# Patient Record
Sex: Female | Born: 1980 | Race: White | Hispanic: No | Smoking: Former smoker
Health system: Southern US, Community
[De-identification: ages and names within clinical notes are randomized; demographics above are authoritative.]

## PROBLEM LIST (undated history)

## (undated) ENCOUNTER — Ambulatory Visit: Admission: EM | Payer: Self-pay | Source: Home / Self Care

## (undated) DIAGNOSIS — I7300 Raynaud's syndrome without gangrene: Secondary | ICD-10-CM

## (undated) DIAGNOSIS — M797 Fibromyalgia: Secondary | ICD-10-CM

## (undated) DIAGNOSIS — J189 Pneumonia, unspecified organism: Secondary | ICD-10-CM

## (undated) HISTORY — PX: SHOULDER SURGERY: SHX246

---

## 2012-04-07 ENCOUNTER — Encounter (HOSPITAL_BASED_OUTPATIENT_CLINIC_OR_DEPARTMENT_OTHER): Payer: Self-pay | Admitting: Family Medicine

## 2012-04-07 ENCOUNTER — Emergency Department (HOSPITAL_BASED_OUTPATIENT_CLINIC_OR_DEPARTMENT_OTHER)
Admission: EM | Admit: 2012-04-07 | Discharge: 2012-04-07 | Disposition: A | Discharge status: Home / Self Care | Payer: Self-pay | Attending: Emergency Medicine | Admitting: Emergency Medicine

## 2012-04-07 DIAGNOSIS — R509 Fever, unspecified: Secondary | ICD-10-CM | POA: Insufficient documentation

## 2012-04-07 DIAGNOSIS — IMO0001 Reserved for inherently not codable concepts without codable children: Secondary | ICD-10-CM | POA: Insufficient documentation

## 2012-04-07 DIAGNOSIS — R21 Rash and other nonspecific skin eruption: Secondary | ICD-10-CM | POA: Insufficient documentation

## 2012-04-07 DIAGNOSIS — R51 Headache: Secondary | ICD-10-CM | POA: Insufficient documentation

## 2012-04-07 DIAGNOSIS — F17200 Nicotine dependence, unspecified, uncomplicated: Secondary | ICD-10-CM | POA: Insufficient documentation

## 2012-04-07 DIAGNOSIS — M2550 Pain in unspecified joint: Secondary | ICD-10-CM | POA: Insufficient documentation

## 2012-04-07 HISTORY — DX: Fibromyalgia: M79.7

## 2012-04-07 LAB — COMPREHENSIVE METABOLIC PANEL
ALT: 14 U/L (ref 0–35)
AST: 15 U/L (ref 0–37)
Alkaline Phosphatase: 55 U/L (ref 39–117)
CO2: 25 mEq/L (ref 19–32)
Calcium: 8.7 mg/dL (ref 8.4–10.5)
Potassium: 3.9 mEq/L (ref 3.5–5.1)
Sodium: 140 mEq/L (ref 135–145)
Total Protein: 5.8 g/dL — ABNORMAL LOW (ref 6.0–8.3)

## 2012-04-07 LAB — CBC
MCV: 99.2 fL (ref 78.0–100.0)
Platelets: 171 10*3/uL (ref 150–400)
RDW: 12.9 % (ref 11.5–15.5)
WBC: 5.5 10*3/uL (ref 4.0–10.5)

## 2012-04-07 LAB — DIFFERENTIAL
Basophils Absolute: 0 10*3/uL (ref 0.0–0.1)
Eosinophils Absolute: 0.1 10*3/uL (ref 0.0–0.7)
Eosinophils Relative: 2 % (ref 0–5)
Lymphocytes Relative: 27 % (ref 12–46)
Neutrophils Relative %: 60 % (ref 43–77)

## 2012-04-07 LAB — APTT: aPTT: 30 seconds (ref 24–37)

## 2012-04-07 NOTE — ED Notes (Signed)
Pt sts she was diagnosed on 03/20/2012 with [place] spotted fever. Pt sts she took 2 different antibiotics, amoxicillin for strep and doxycycline for rocky mtn. Pt sts her PCP said "she doesn't know what else to do for me".

## 2012-04-07 NOTE — Discharge Instructions (Signed)
The outpatient clinic should contact you for an appointment within the next 24 hours. Please return if you or unable to keep down fluids.are worse at any time such as high fever you're unable to control with Motrin or Tylenol

## 2012-04-07 NOTE — ED Notes (Signed)
Beeped Dr. Ninetta Lights direct-- on [phone]--calling Dr. Rosalia Hammers back on [phone]

## 2012-04-07 NOTE — ED Provider Notes (Signed)
History     CSN: [id number]  Arrival date & time 04/07/12  1038   First MD Initiated Contact with Patient 04/07/12 1138      Chief Complaint  Patient presents with  . Rash  . Fever    (Consider location/radiation/quality/duration/timing/severity/associated sxs/prior treatment) HPI   Patient states treated for rmsf after diagnosis on 4/23 finished doxycycline on 4/30.  Patient states she began to feel bad again toward end of treatment.  Seen at [facility] with fever, nausea, muscle aches, and joint pain on Wednesday May 1 and states labs sent and told to follow up with pmd.  Patient restarted on doxycycline which completes tomorrow.  Patient states continues with fever to 100.6 and continues to have joint pain.  She states she spoke with pmd and states no appointment to follow up with PMD Tiffany [facility] from [facility].  Patient with continued fatigue and states she continues to have "new spots."  Started on feet and now spread up legs, no itching, or pain.    Past Medical History  Diagnosis Date  . Migraine   . Fibromyalgia     Past Surgical History  Procedure Date  . Shoulder surgery     No family history on file.  History  Substance Use Topics  . Smoking status: Current Everyday Smoker  . Smokeless tobacco: Not on file  . Alcohol Use: Yes    OB History    Grav Para Term Preterm Abortions TAB SAB Ect Mult Living                  Review of Systems  All other systems reviewed and are negative.    Allergies  Sulfa antibiotics  Home Medications   Current Outpatient Rx  Name Route Sig Dispense Refill  . CELEXA PO Oral Take by mouth.    . TOPAMAX PO Oral Take by mouth.    . TRAMADOL HCL 50 MG PO TABS Oral Take 50 mg by mouth every 6 (six) hours as needed.      BP 120/89  Pulse 100  Temp(Src) 98.5 F (36.9 C) (Oral)  Resp 16  Ht 5\' 4"  (1.626 m)  Wt 101 lb (45.813 kg)  BMI 17.34 kg/m2  SpO2 100%  LMP 03/31/2012  Physical Exam  Nursing  note and vitals reviewed. Constitutional: She appears well-developed and well-nourished.  HENT:  Head: Normocephalic and atraumatic.  Eyes: Conjunctivae and EOM are normal. Pupils are equal, round, and reactive to light.  Neck: Normal range of motion. Neck supple.  Cardiovascular: Normal rate, regular rhythm, normal heart sounds and intact distal pulses.   Pulmonary/Chest: Effort normal and breath sounds normal.  Abdominal: Soft. Bowel sounds are normal.  Musculoskeletal: Normal range of motion.  Neurological: She is alert.  Skin: Skin is warm and dry.       Macular rash on bilateral lower extremities and trunk  Psychiatric: She has a normal mood and affect. Thought content normal.    ED Course  Procedures (including critical care time)  Labs Reviewed - No data to display No results found.   No diagnosis found.    MDM  rms igg1.26- from [facility] Labs obtained here and normal. Patient continues to complain of rash and muscle aches, joint pain, and headaches. Patient's care discussed with Dr. Orvan Falconer on call for infectious disease. They will call the patient and see her in followup in the infectious disease clinic. The patient advised and will call the infectious disease clinic and they have  not called her the next 24 hours. She is in understanding and agreement with the plan.       Hilario Quarry, MD 04/07/12 [phone]

## 2012-04-10 ENCOUNTER — Ambulatory Visit (INDEPENDENT_AMBULATORY_CARE_PROVIDER_SITE_OTHER): Payer: Self-pay | Admitting: Internal Medicine

## 2012-04-10 ENCOUNTER — Encounter: Payer: Self-pay | Admitting: Internal Medicine

## 2012-04-10 VITALS — BP 116/79 | HR 107 | Temp 98.8°F | Ht 64.0 in | Wt 103.0 lb

## 2012-04-10 DIAGNOSIS — M797 Fibromyalgia: Secondary | ICD-10-CM | POA: Insufficient documentation

## 2012-04-10 DIAGNOSIS — L42 Pityriasis rosea: Secondary | ICD-10-CM

## 2012-04-10 DIAGNOSIS — R21 Rash and other nonspecific skin eruption: Secondary | ICD-10-CM | POA: Insufficient documentation

## 2012-04-10 DIAGNOSIS — IMO0001 Reserved for inherently not codable concepts without codable children: Secondary | ICD-10-CM

## 2012-04-10 DIAGNOSIS — G43909 Migraine, unspecified, not intractable, without status migrainosus: Secondary | ICD-10-CM | POA: Insufficient documentation

## 2012-04-10 MED ORDER — HYDROCORTISONE 1 % EX CREA: TOPICAL_CREAM | Freq: Two times a day (BID) | CUTANEOUS | Status: DC

## 2012-04-10 NOTE — Progress Notes (Signed)
Patient ID: Nicole Mccoy, female   DOB: 1981-06-28, 31 y.o.   MRN: [id number] Infectious Diseases Initial Consultation         Reason for Consult: Rash Referring Physician: Dr. Margarita Grizzle  Patient Active Problem List  Diagnoses  . PR (pityriasis rosea)  . Fibromyalgia  . Migraine      Recommendations: 1. Careful sun exposure for rash 2. 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 The transient febrile prodrome and diffuse progressive rash and otherwise healthy young woman is typical. It appears that secondary syphilis has been ruled out and the duration would make that extremely unlikely. This is not [facility] spotted fever and I suspect the antibody test was a false positive or reflex old asymptomatic infection. I reviewed with her the uncertainty about what causes pityriasis and also how best to treat it. I suggested that she try careful sun exposure as that has been shown to help with clearing of the lesions. She has developed some mild pruritus and I suggested that she use topical corticosteroid cream. If this is pityriasis I expect it to resolve within the next 4 weeks. This does not resolve then we'll consider punch biopsy and/or a referral to dermatology.   HPI: Nicole Mccoy is a 31 y.o. female who was relatively well until she developed a rash around her hands and feet on April 17. A close contacted recently been diagnosed with strep throat and she was concerned that she might have a similar illness although she did not have any sore throat. She states that she did have fever transiently up to 101.5, was more tired than usual for her baseline fibromyalgia and had more aching in her muscles and joints than usual. She went to the [facility] on [address] on April 19 and was told that she might have syphilis, hand-foot-and-mouth disease or [place] spotted fever. She was placed on amoxicillin and  then was called on April 23 and told that her [facility] spotted fever antibody test was positive. She was told that her syphilis test was negative. Doxycycline was added to her treatment regimen. She took amoxicillin for 10 days and doxycycline for 7 days. Her fevers improved but the rash actually got a little bit worse and spread over her legs arms and trunk. Initially she did not have any itching but she has developed some very mild pruritus recently. Because she had not improved significantly she spoke to her primary care doctor, Dr. Primus Bravo, who instructed her to go to [facility] for further evaluation.  She went there on May 1 and CBC and complete metabolic panel were completely normal. She to them that she had some problems concentrating and a few episodes where she thoughts her speech might of been slurred. She had a normal head CT scan and a normal chest x-ray. She was given her second round of doxycycline which she took for another 7 days. She did not improve and went to the [facility] emergency department on May 8 where she was referred to me.  She is currently unemployed. She has a healthy 77-year-old child. She has not had any travel out of the country. She had all of her childhood immunizations as far as she knows. Other than her recent contact with somebody with strep throat she does not know of any contacts who have had recent unusual illness. She's not had any unusual contact with pets  or animals.   Review of Systems: Pertinent items are noted in HPI.      Past Medical History  Diagnosis Date  . Migraine   . Fibromyalgia     History  Substance Use Topics  . Smoking status: Current Everyday Smoker -- 0.5 packs/day    Types: Cigarettes  . Smokeless tobacco: Never Used  . Alcohol Use: Yes     rare    No family history on file. Allergies  Allergen Reactions  . Sulfa Antibiotics     OBJECTIVE: Blood pressure 116/79, pulse 107, temperature 98.8 F  (37.1 C), temperature source Oral, height 5\' 4"  (1.626 m), weight 46.72 kg (103 lb), last menstrual period 03/31/2012. General: She is a healthy-appearing young female Skin: She has a diffuse papular rash in various stages of evolution. Most of the lesions are salmon-colored. They range in size from about 1 mm up to about 10 mm. The larger lesions have some superficial scale. There are no vesicles or pustules. The lesions are on her feet legs trunk hands and arms. There is sparing of her face and scalp. Nodes: No palpable adenopathy HEENT: Mouth and throat are clear without any lesions. External eye exam is normal. Lungs: Clear Cor: Regular S1 and S2 with no murmurs heard Abdomen: Soft and nontender. I cannot feel a liver, spleen or other masses Joints: Normal Neuro: Her speech is normal. She is alert and oriented x3. She has no focal neurologic deficits  Microbiology: No results found for this or any previous visit (from the past 240 hour(s)).  Cliffton Asters, MD [facility] for Infectious Diseases [facility] [phone] pager   [phone] cell 04/10/2012, 12:59 PM

## 2012-04-23 ENCOUNTER — Ambulatory Visit (INDEPENDENT_AMBULATORY_CARE_PROVIDER_SITE_OTHER): Payer: Self-pay | Admitting: Internal Medicine

## 2012-04-23 ENCOUNTER — Encounter: Payer: Self-pay | Admitting: Internal Medicine

## 2012-04-23 VITALS — BP 104/78 | HR 89 | Temp 98.5°F | Ht 64.0 in | Wt 102.0 lb

## 2012-04-23 DIAGNOSIS — R21 Rash and other nonspecific skin eruption: Secondary | ICD-10-CM

## 2012-04-23 NOTE — Progress Notes (Signed)
Patient ID: Nicole Mccoy, female   DOB: 12-05-1980, 31 y.o.   MRN: [id number]     [facility] for Infectious Disease  Patient Active Problem List  Diagnoses  . Rash  . Fibromyalgia  . Migraine    Patient's Medications  New Prescriptions   No medications on file  Previous Medications   CITALOPRAM HYDROBROMIDE (CELEXA PO)    Take by mouth.   HYDROCORTISONE CREAM 1 %    Apply topically 2 (two) times daily.   TOPIRAMATE (TOPAMAX PO)    Take by mouth.   TRAMADOL (ULTRAM) 50 MG TABLET    Take 50 mg by mouth every 6 (six) hours as needed.  Modified Medications   No medications on file  Discontinued Medications   No medications on file    Subjective: Nicole Mccoy is in for her routine followup visit. She continues to be bothered by her rash and notes that she has new lesions on her legs and feet. She believes the rash on her trunk has improved. She noticed a slight change in color and increased itching after she laid out in the sun after her last visit. She has used some topical hydrocortisone cream but has not noticed much improvement.  Objective: Temp: 98.5 F (36.9 C) (05/23 1150) Temp src: Oral (05/23 1150) BP: 104/78 mmHg (05/23 1150) Pulse Rate: 89  (05/23 1150)  General: She is in no distress Skin: The lesions on her trunk have improved and are much fewer in number. The lesions on her upper legs appear to be the same in number but now have a superficial scale. She seems to have more lesions on her lower legs and particularly around her feet but sparing the soles of her feet. She is more scaly and peeling around her toenails.  Assessment: Is still possible that her rash is due to pityriasis rosea but I am not as convinced given the duration. She is now entering her sixth week of rash. I will also consider or some variant of eczema as well as tinea infection. She has been on Celexa, Topamax and Ultram for many years which makes these less likely culprits. She has seen a  dermatologist in the past but cannot recall his name. I suggested that she call is back with his name and we will try to facilitate a referral.  Plan: 1. Dermatology referral   Cliffton Asters, MD [facility] [phone] pager   [phone] cell 04/23/2012, 12:19 PM

## 2013-01-02 ENCOUNTER — Emergency Department (HOSPITAL_BASED_OUTPATIENT_CLINIC_OR_DEPARTMENT_OTHER)
Admission: EM | Admit: 2013-01-02 | Discharge: 2013-01-02 | Disposition: A | Discharge status: Home / Self Care | Payer: No Typology Code available for payment source | Attending: Emergency Medicine | Admitting: Emergency Medicine

## 2013-01-02 ENCOUNTER — Encounter (HOSPITAL_BASED_OUTPATIENT_CLINIC_OR_DEPARTMENT_OTHER): Payer: Self-pay | Admitting: Emergency Medicine

## 2013-01-02 DIAGNOSIS — Z3202 Encounter for pregnancy test, result negative: Secondary | ICD-10-CM | POA: Insufficient documentation

## 2013-01-02 DIAGNOSIS — G43909 Migraine, unspecified, not intractable, without status migrainosus: Secondary | ICD-10-CM | POA: Insufficient documentation

## 2013-01-02 DIAGNOSIS — Z79899 Other long term (current) drug therapy: Secondary | ICD-10-CM | POA: Insufficient documentation

## 2013-01-02 DIAGNOSIS — F17200 Nicotine dependence, unspecified, uncomplicated: Secondary | ICD-10-CM | POA: Insufficient documentation

## 2013-01-02 DIAGNOSIS — T7421XA Adult sexual abuse, confirmed, initial encounter: Secondary | ICD-10-CM

## 2013-01-02 DIAGNOSIS — IMO0001 Reserved for inherently not codable concepts without codable children: Secondary | ICD-10-CM | POA: Insufficient documentation

## 2013-01-02 HISTORY — DX: Raynaud's syndrome without gangrene: I73.00

## 2013-01-02 MED ORDER — PROMETHAZINE HCL 25 MG PO TABS
ORAL_TABLET | ORAL | Status: AC
  Administered 2013-01-02: 25 mg
  Filled 2013-01-02: qty 3

## 2013-01-02 MED ORDER — PROMETHAZINE HCL 25 MG/ML IJ SOLN
25.0000 mg | Freq: Once | INTRAMUSCULAR | Status: AC
  Administered 2013-01-02: 25 mg via INTRAMUSCULAR
  Filled 2013-01-02: qty 1

## 2013-01-02 MED ORDER — AZITHROMYCIN 1 G PO PACK
PACK | ORAL | Status: AC
  Administered 2013-01-02: 1 g
  Filled 2013-01-02: qty 1

## 2013-01-02 MED ORDER — CEFIXIME 400 MG PO TABS
ORAL_TABLET | ORAL | Status: AC
  Administered 2013-01-02: 400 mg
  Filled 2013-01-02: qty 1

## 2013-01-02 MED ORDER — METRONIDAZOLE 500 MG PO TABS
ORAL_TABLET | ORAL | Status: AC
  Administered 2013-01-02: 2000 mg
  Filled 2013-01-02: qty 4

## 2013-01-02 MED ORDER — KETOROLAC TROMETHAMINE 60 MG/2ML IM SOLN
60.0000 mg | Freq: Once | INTRAMUSCULAR | Status: AC
  Administered 2013-01-02: 60 mg via INTRAMUSCULAR
  Filled 2013-01-02: qty 2

## 2013-01-02 MED ORDER — LEVONORGESTREL 0.75 MG PO TABS
ORAL_TABLET | ORAL | Status: AC
  Administered 2013-01-02: 1.5 mg
  Filled 2013-01-02: qty 2
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
## 2013-01-02 NOTE — ED Notes (Signed)
Patient's mother called to main number asking to speak to the patient. Patient requests not to speak to her mother at this time, and is visibly upset. Consulting civil engineer notified. Mothers contact information taken and advised her that the patient would contact her at her convenience. Patients mother states she is on her way to this facility. Consulting civil engineer and assigned RN made aware.
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
## 2013-01-02 NOTE — ED Notes (Signed)
SANE RN has competed exam and patient given meds for migraine prior to discharge. Parents here to drive patient home
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
## 2013-01-03 ENCOUNTER — Emergency Department (HOSPITAL_BASED_OUTPATIENT_CLINIC_OR_DEPARTMENT_OTHER): Payer: Self-pay

## 2013-01-03 ENCOUNTER — Emergency Department (HOSPITAL_BASED_OUTPATIENT_CLINIC_OR_DEPARTMENT_OTHER)
Admission: EM | Admit: 2013-01-03 | Discharge: 2013-01-03 | Disposition: A | Discharge status: Home / Self Care | Payer: Self-pay | Attending: Emergency Medicine | Admitting: Emergency Medicine

## 2013-01-03 ENCOUNTER — Encounter (HOSPITAL_BASED_OUTPATIENT_CLINIC_OR_DEPARTMENT_OTHER): Payer: Self-pay | Admitting: *Deleted

## 2013-01-03 DIAGNOSIS — Y33XXXA Other specified events, undetermined intent, initial encounter: Secondary | ICD-10-CM | POA: Insufficient documentation

## 2013-01-03 DIAGNOSIS — M542 Cervicalgia: Secondary | ICD-10-CM | POA: Insufficient documentation

## 2013-01-03 DIAGNOSIS — R000 Tachycardia, unspecified: Secondary | ICD-10-CM | POA: Insufficient documentation

## 2013-01-03 DIAGNOSIS — Y939 Activity, unspecified: Secondary | ICD-10-CM | POA: Insufficient documentation

## 2013-01-03 DIAGNOSIS — Z8739 Personal history of other diseases of the musculoskeletal system and connective tissue: Secondary | ICD-10-CM | POA: Insufficient documentation

## 2013-01-03 DIAGNOSIS — IMO0001 Reserved for inherently not codable concepts without codable children: Secondary | ICD-10-CM | POA: Insufficient documentation

## 2013-01-03 DIAGNOSIS — Y929 Unspecified place or not applicable: Secondary | ICD-10-CM | POA: Insufficient documentation

## 2013-01-03 DIAGNOSIS — R070 Pain in throat: Secondary | ICD-10-CM | POA: Insufficient documentation

## 2013-01-03 DIAGNOSIS — F17200 Nicotine dependence, unspecified, uncomplicated: Secondary | ICD-10-CM | POA: Insufficient documentation

## 2013-01-03 DIAGNOSIS — Z79899 Other long term (current) drug therapy: Secondary | ICD-10-CM | POA: Insufficient documentation

## 2013-01-03 DIAGNOSIS — T148XXA Other injury of unspecified body region, initial encounter: Secondary | ICD-10-CM

## 2013-01-03 DIAGNOSIS — I7300 Raynaud's syndrome without gangrene: Secondary | ICD-10-CM | POA: Insufficient documentation

## 2013-01-03 MED ORDER — LORAZEPAM 1 MG PO TABS
1.0000 mg | ORAL_TABLET | Freq: Once | ORAL | Status: DC
  Filled 2013-01-03: qty 1

## 2013-01-03 MED ORDER — ALPRAZOLAM 0.5 MG PO TABS
ORAL_TABLET | ORAL | Status: AC
  Administered 2013-01-03: 0.5 mg via ORAL
  Filled 2013-01-03: qty 1

## 2013-01-03 MED ORDER — ALPRAZOLAM 0.5 MG PO TABS
0.5000 mg | ORAL_TABLET | Freq: Once | ORAL | Status: AC
  Administered 2013-01-03: 0.5 mg via ORAL

## 2013-01-03 NOTE — ED Notes (Signed)
Patient complaining of increased neck pain/ soreness. SANE nurse stated that her neck measurement yesterday was 12 in, today measurement 12 1/4 inches. No problems speaking or swallowing.  Patient c/o severe right hip pain. No additional bruising noted by the patient.
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
## 2013-01-03 NOTE — ED Notes (Signed)
Pt was seen here yesterday and throat and hip are still sore. Tried to reach SANE RN, told to come here.
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
## 2013-01-03 NOTE — ED Notes (Signed)
Pt transferred with Sane nurse to [facility] exam room.

## 2013-01-03 NOTE — ED Notes (Signed)
SANE nurse here at [facility] to meet patient.  Pt had called at 1120 to SANE to meet at [facility] regarding some "concerns".  Pt is not currently in department.

## 2013-01-03 NOTE — ED Provider Notes (Signed)
I  reviewed the resident's note and I agree with the findings and plan.    Nelia Shi, MD 01/03/13 1504
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
## 2013-01-07 LAB — POCT PREGNANCY, URINE: Preg Test, Ur: NEGATIVE

## 2013-01-20 ENCOUNTER — Other Ambulatory Visit (HOSPITAL_BASED_OUTPATIENT_CLINIC_OR_DEPARTMENT_OTHER): Payer: Self-pay | Admitting: Family Medicine

## 2013-01-20 ENCOUNTER — Ambulatory Visit (HOSPITAL_BASED_OUTPATIENT_CLINIC_OR_DEPARTMENT_OTHER)
Admission: RE | Admit: 2013-01-20 | Discharge: 2013-01-20 | Disposition: A | Discharge status: Home / Self Care | Payer: Self-pay | Source: Ambulatory Visit | Attending: Family Medicine | Admitting: Family Medicine

## 2013-01-20 ENCOUNTER — Ambulatory Visit (HOSPITAL_BASED_OUTPATIENT_CLINIC_OR_DEPARTMENT_OTHER): Payer: Self-pay

## 2013-01-20 DIAGNOSIS — T7421XA Adult sexual abuse, confirmed, initial encounter: Secondary | ICD-10-CM

## 2013-01-20 DIAGNOSIS — M542 Cervicalgia: Secondary | ICD-10-CM

## 2013-01-20 MED ORDER — IOHEXOL 350 MG/ML SOLN
100.0000 mL | Freq: Once | INTRAVENOUS | Status: AC | PRN
  Administered 2013-01-20: 100 mL via INTRAVENOUS

## 2013-02-11 ENCOUNTER — Emergency Department (HOSPITAL_BASED_OUTPATIENT_CLINIC_OR_DEPARTMENT_OTHER)
Admission: EM | Admit: 2013-02-11 | Discharge: 2013-02-11 | Disposition: A | Discharge status: Home / Self Care | Payer: Self-pay | Attending: Emergency Medicine | Admitting: Emergency Medicine

## 2013-02-11 ENCOUNTER — Encounter (HOSPITAL_BASED_OUTPATIENT_CLINIC_OR_DEPARTMENT_OTHER): Payer: Self-pay | Admitting: *Deleted

## 2013-02-11 DIAGNOSIS — F419 Anxiety disorder, unspecified: Secondary | ICD-10-CM

## 2013-02-11 DIAGNOSIS — IMO0001 Reserved for inherently not codable concepts without codable children: Secondary | ICD-10-CM | POA: Insufficient documentation

## 2013-02-11 DIAGNOSIS — Z3202 Encounter for pregnancy test, result negative: Secondary | ICD-10-CM | POA: Insufficient documentation

## 2013-02-11 DIAGNOSIS — N390 Urinary tract infection, site not specified: Secondary | ICD-10-CM | POA: Insufficient documentation

## 2013-02-11 DIAGNOSIS — Z87828 Personal history of other (healed) physical injury and trauma: Secondary | ICD-10-CM | POA: Insufficient documentation

## 2013-02-11 DIAGNOSIS — G479 Sleep disorder, unspecified: Secondary | ICD-10-CM | POA: Insufficient documentation

## 2013-02-11 DIAGNOSIS — F3289 Other specified depressive episodes: Secondary | ICD-10-CM | POA: Insufficient documentation

## 2013-02-11 DIAGNOSIS — R112 Nausea with vomiting, unspecified: Secondary | ICD-10-CM | POA: Insufficient documentation

## 2013-02-11 DIAGNOSIS — G43909 Migraine, unspecified, not intractable, without status migrainosus: Secondary | ICD-10-CM | POA: Insufficient documentation

## 2013-02-11 DIAGNOSIS — F17200 Nicotine dependence, unspecified, uncomplicated: Secondary | ICD-10-CM | POA: Insufficient documentation

## 2013-02-11 DIAGNOSIS — F329 Major depressive disorder, single episode, unspecified: Secondary | ICD-10-CM | POA: Insufficient documentation

## 2013-02-11 DIAGNOSIS — R5381 Other malaise: Secondary | ICD-10-CM | POA: Insufficient documentation

## 2013-02-11 DIAGNOSIS — R630 Anorexia: Secondary | ICD-10-CM | POA: Insufficient documentation

## 2013-02-11 DIAGNOSIS — F411 Generalized anxiety disorder: Secondary | ICD-10-CM | POA: Insufficient documentation

## 2013-02-11 DIAGNOSIS — Z8679 Personal history of other diseases of the circulatory system: Secondary | ICD-10-CM | POA: Insufficient documentation

## 2013-02-11 DIAGNOSIS — Z79899 Other long term (current) drug therapy: Secondary | ICD-10-CM | POA: Insufficient documentation

## 2013-02-11 LAB — CBC WITH DIFFERENTIAL/PLATELET
Basophils Relative: 0 % (ref 0–1)
Hemoglobin: 14.8 g/dL (ref 12.0–15.0)
Lymphocytes Relative: 31 % (ref 12–46)
Lymphs Abs: 1.7 10*3/uL (ref 0.7–4.0)
Monocytes Relative: 11 % (ref 3–12)
Neutro Abs: 3.1 10*3/uL (ref 1.7–7.7)
Neutrophils Relative %: 56 % (ref 43–77)
RBC: 4.63 MIL/uL (ref 3.87–5.11)
WBC: 5.6 10*3/uL (ref 4.0–10.5)

## 2013-02-11 LAB — URINALYSIS, ROUTINE W REFLEX MICROSCOPIC
Ketones, ur: 15 mg/dL — AB
Nitrite: NEGATIVE
pH: 5.5 (ref 5.0–8.0)

## 2013-02-11 LAB — LIPASE, BLOOD: Lipase: 64 U/L — ABNORMAL HIGH (ref 11–59)

## 2013-02-11 LAB — COMPREHENSIVE METABOLIC PANEL
Albumin: 4.4 g/dL (ref 3.5–5.2)
Alkaline Phosphatase: 60 U/L (ref 39–117)
BUN: 6 mg/dL (ref 6–23)
CO2: 21 mEq/L (ref 19–32)
Chloride: 106 mEq/L (ref 96–112)
Glucose, Bld: 89 mg/dL (ref 70–99)
Potassium: 3.6 mEq/L (ref 3.5–5.1)
Total Bilirubin: 0.2 mg/dL — ABNORMAL LOW (ref 0.3–1.2)

## 2013-02-11 LAB — URINE MICROSCOPIC-ADD ON

## 2013-02-11 LAB — PREGNANCY, URINE: Preg Test, Ur: NEGATIVE

## 2013-02-11 MED ORDER — ONDANSETRON HCL 4 MG/2ML IJ SOLN: 4.0000 mg | Freq: Once | INTRAMUSCULAR | Status: DC

## 2013-02-11 MED ORDER — CEPHALEXIN 500 MG PO CAPS: 500.0000 mg | ORAL_CAPSULE | Freq: Four times a day (QID) | ORAL | Status: DC

## 2013-02-11 MED ORDER — SODIUM CHLORIDE 0.9 % IV BOLUS (SEPSIS)
1000.0000 mL | Freq: Once | INTRAVENOUS | Status: AC
  Administered 2013-02-11: 1000 mL via INTRAVENOUS

## 2013-02-11 MED ORDER — ZOLPIDEM TARTRATE 5 MG PO TABS: 5.0000 mg | ORAL_TABLET | Freq: Every evening | ORAL | Status: DC | PRN

## 2013-02-11 MED ORDER — LORAZEPAM 2 MG/ML IJ SOLN
1.0000 mg | Freq: Once | INTRAMUSCULAR | Status: DC
  Filled 2013-02-11: qty 1

## 2013-02-11 MED ORDER — PROMETHAZINE HCL 25 MG/ML IJ SOLN
25.0000 mg | Freq: Once | INTRAMUSCULAR | Status: AC
  Administered 2013-02-11: 25 mg via INTRAVENOUS
  Filled 2013-02-11: qty 1
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
## 2013-02-13 LAB — URINE CULTURE

## 2013-03-17 ENCOUNTER — Ambulatory Visit: Payer: Self-pay | Admitting: Physical Therapy

## 2013-03-18 ENCOUNTER — Ambulatory Visit: Payer: Self-pay | Admitting: Physical Therapy

## 2013-04-28 ENCOUNTER — Encounter (HOSPITAL_BASED_OUTPATIENT_CLINIC_OR_DEPARTMENT_OTHER): Payer: Self-pay

## 2013-04-28 ENCOUNTER — Emergency Department (HOSPITAL_BASED_OUTPATIENT_CLINIC_OR_DEPARTMENT_OTHER)
Admission: EM | Admit: 2013-04-28 | Discharge: 2013-04-28 | Disposition: A | Discharge status: Home / Self Care | Payer: No Typology Code available for payment source | Attending: Emergency Medicine | Admitting: Emergency Medicine

## 2013-04-28 ENCOUNTER — Emergency Department (HOSPITAL_BASED_OUTPATIENT_CLINIC_OR_DEPARTMENT_OTHER): Payer: Self-pay

## 2013-04-28 DIAGNOSIS — F17200 Nicotine dependence, unspecified, uncomplicated: Secondary | ICD-10-CM | POA: Insufficient documentation

## 2013-04-28 DIAGNOSIS — Y92410 Unspecified street and highway as the place of occurrence of the external cause: Secondary | ICD-10-CM | POA: Insufficient documentation

## 2013-04-28 DIAGNOSIS — S199XXA Unspecified injury of neck, initial encounter: Secondary | ICD-10-CM | POA: Insufficient documentation

## 2013-04-28 DIAGNOSIS — Z79899 Other long term (current) drug therapy: Secondary | ICD-10-CM | POA: Insufficient documentation

## 2013-04-28 DIAGNOSIS — IMO0001 Reserved for inherently not codable concepts without codable children: Secondary | ICD-10-CM | POA: Insufficient documentation

## 2013-04-28 DIAGNOSIS — Z8679 Personal history of other diseases of the circulatory system: Secondary | ICD-10-CM | POA: Insufficient documentation

## 2013-04-28 DIAGNOSIS — S139XXA Sprain of joints and ligaments of unspecified parts of neck, initial encounter: Secondary | ICD-10-CM | POA: Insufficient documentation

## 2013-04-28 DIAGNOSIS — S161XXA Strain of muscle, fascia and tendon at neck level, initial encounter: Secondary | ICD-10-CM

## 2013-04-28 DIAGNOSIS — Y9389 Activity, other specified: Secondary | ICD-10-CM | POA: Insufficient documentation

## 2013-04-28 DIAGNOSIS — S0993XA Unspecified injury of face, initial encounter: Secondary | ICD-10-CM | POA: Insufficient documentation

## 2013-04-28 DIAGNOSIS — G43909 Migraine, unspecified, not intractable, without status migrainosus: Secondary | ICD-10-CM | POA: Insufficient documentation

## 2013-04-28 MED ORDER — HYDROCODONE-ACETAMINOPHEN 5-325 MG PO TABS: 1.0000 | ORAL_TABLET | ORAL | Status: DC | PRN
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
## 2013-04-28 NOTE — ED Provider Notes (Signed)
Medical screening examination/treatment/procedure(s) were performed by non-physician practitioner and as supervising physician I was immediately available for consultation/collaboration.   Jarnell Cordaro, MD 04/28/13 2350 

## 2013-04-28 NOTE — ED Notes (Signed)
Patient transported to X-ray 

## 2013-04-29 ENCOUNTER — Telehealth (HOSPITAL_BASED_OUTPATIENT_CLINIC_OR_DEPARTMENT_OTHER): Payer: Self-pay | Admitting: *Deleted

## 2013-04-29 NOTE — ED Notes (Signed)
Pt called requesting note stating she was seen and can go back to work

## 2013-07-15 ENCOUNTER — Emergency Department (HOSPITAL_BASED_OUTPATIENT_CLINIC_OR_DEPARTMENT_OTHER)
Admission: EM | Admit: 2013-07-15 | Discharge: 2013-07-15 | Disposition: A | Discharge status: Home / Self Care | Payer: Medicaid Other | Attending: Emergency Medicine | Admitting: Emergency Medicine

## 2013-07-15 ENCOUNTER — Encounter (HOSPITAL_BASED_OUTPATIENT_CLINIC_OR_DEPARTMENT_OTHER): Payer: Self-pay | Admitting: Student

## 2013-07-15 DIAGNOSIS — I7300 Raynaud's syndrome without gangrene: Secondary | ICD-10-CM | POA: Insufficient documentation

## 2013-07-15 DIAGNOSIS — Z79899 Other long term (current) drug therapy: Secondary | ICD-10-CM | POA: Insufficient documentation

## 2013-07-15 DIAGNOSIS — F17200 Nicotine dependence, unspecified, uncomplicated: Secondary | ICD-10-CM | POA: Insufficient documentation

## 2013-07-15 DIAGNOSIS — G43909 Migraine, unspecified, not intractable, without status migrainosus: Secondary | ICD-10-CM

## 2013-07-15 DIAGNOSIS — R112 Nausea with vomiting, unspecified: Secondary | ICD-10-CM | POA: Insufficient documentation

## 2013-07-15 MED ORDER — METOCLOPRAMIDE HCL 5 MG/ML IJ SOLN
10.0000 mg | Freq: Once | INTRAMUSCULAR | Status: AC
  Administered 2013-07-15: 10 mg via INTRAMUSCULAR
  Filled 2013-07-15: qty 2

## 2013-07-15 MED ORDER — KETOROLAC TROMETHAMINE 30 MG/ML IJ SOLN
60.0000 mg | Freq: Once | INTRAMUSCULAR | Status: AC
  Administered 2013-07-15: 60 mg via INTRAMUSCULAR
  Filled 2013-07-15 (×2): qty 1

## 2013-07-15 MED ORDER — DIPHENHYDRAMINE HCL 50 MG/ML IJ SOLN
25.0000 mg | Freq: Once | INTRAMUSCULAR | Status: AC
  Administered 2013-07-15: 25 mg via INTRAVENOUS
  Filled 2013-07-15: qty 1

## 2013-07-15 NOTE — ED Notes (Addendum)
Pt in with c/o typical migraine headache. Reports pain in back of head radiating forward. Reports ear pressure and + photophobia. No new s/sx with this migraine. Unrelieved by OTC acetaminophen or hydrocodone.

## 2013-07-15 NOTE — Discharge Instructions (Signed)
Migraine Headache A migraine headache is very bad, throbbing pain on one or both sides of your head. Talk to your doctor about what things may bring on (trigger) your migraine headaches. HOME CARE  Only take medicines as told by your doctor.  Lie down in a dark, quiet room when you have a migraine.  Keep a journal to find out if certain things bring on migraine headaches. For example, write down:  What you eat and drink.  How much sleep you get.  Any change to your diet or medicines.  Lessen how much alcohol you drink.  Quit smoking if you smoke.  Get enough sleep.  Lessen any stress in your life.  Keep lights dim if bright lights bother you or make your migraines worse. GET HELP RIGHT AWAY IF:   Your migraine becomes really bad.  You have a fever.  You have a stiff neck.  You have trouble seeing.  Your muscles are weak, or you lose muscle control.  You lose your balance or have trouble walking.  You feel like you will pass out (faint), or you pass out.  You have really bad symptoms that are different than your first symptoms. MAKE SURE YOU:   Understand these instructions.  Will watch your condition.  Will get help right away if you are not doing well or get worse. Document Released: 08/27/2008 Document Revised: 02/10/2012 Document Reviewed: 11/08/2011 [employer] Patient Information 2014 [employer].  

## 2013-07-15 NOTE — ED Provider Notes (Signed)
CSN: [id number]     Arrival date & time 07/15/13  1937 History     First MD Initiated Contact with Patient 07/15/13 1953     Chief Complaint  Patient presents with  . Migraine   (Consider location/radiation/quality/duration/timing/severity/associated sxs/prior Treatment) HPI Comments: Pt states that she has a history of migraines and this is similar to previous episodes in the past:pt states that she usually gets toradol and phenergan  Patient is a 32 y.o. female presenting with migraines. The history is provided by the patient. No language interpreter was used.  Migraine This is a recurrent problem. The current episode started in the past 7 days. The problem occurs constantly. The problem has been unchanged. Associated symptoms include nausea and vomiting. Pertinent negatives include no fever. Nothing aggravates the symptoms.    Past Medical History  Diagnosis Date  . Migraine   . Fibromyalgia   . Raynaud's syndrome    Past Surgical History  Procedure Laterality Date  . Shoulder surgery     History reviewed. No pertinent family history. History  Substance Use Topics  . Smoking status: Current Every Day Smoker -- 0.50 packs/day    Types: Cigarettes  . Smokeless tobacco: Never Used  . Alcohol Use: Yes     Comment: rare   OB History   Grav Para Term Preterm Abortions TAB SAB Ect Mult Living                 Review of Systems  Constitutional: Negative for fever.  Respiratory: Negative.   Cardiovascular: Negative.   Gastrointestinal: Positive for nausea and vomiting.    Allergies  Lorazepam and Sulfa antibiotics  Home Medications   Current Outpatient Rx  Name  Route  Sig  Dispense  Refill  . cephALEXin (KEFLEX) 500 MG capsule   Oral   Take 1 capsule (500 mg total) by mouth 4 (four) times daily.   40 capsule   0   . Citalopram Hydrobromide (CELEXA PO)   Oral   Take by mouth.         . clonazePAM (KLONOPIN) 1 MG tablet   Oral   Take 1 mg by mouth 2 (two)  times daily as needed.         Marland Kitchen HYDROcodone-acetaminophen (NORCO/VICODIN) 5-325 MG per tablet   Oral   Take 1 tablet by mouth every 4 (four) hours as needed for pain.   10 tablet   0   . Topiramate (TOPAMAX PO)   Oral   Take by mouth.         . traMADol (ULTRAM) 50 MG tablet   Oral   Take 50 mg by mouth every 6 (six) hours as needed.         . zolpidem (AMBIEN) 5 MG tablet   Oral   Take 1 tablet (5 mg total) by mouth at bedtime as needed for sleep.   3 tablet   0    BP 104/72  Pulse 112  Temp(Src) 98.9 F (37.2 C) (Oral)  Resp 16  Ht 5\' 4"  (1.626 m)  Wt 104 lb (47.174 kg)  BMI 17.84 kg/m2  SpO2 100% Physical Exam  Nursing note and vitals reviewed. Constitutional: She appears well-developed and well-nourished.  HENT:  Head: Normocephalic and atraumatic.  Eyes: Conjunctivae and EOM are normal. Pupils are equal, round, and reactive to light.  Neck: Normal range of motion. Neck supple.  Cardiovascular: Normal rate and regular rhythm.   Pulmonary/Chest: Effort normal and breath sounds normal.  Musculoskeletal: Normal range of motion.  Neurological: She is alert.  Skin: Skin is warm and dry.    ED Course   Procedures (including critical care time)  Labs Reviewed - No data to display No results found. 1. Migraine     MDM  Pt is feeling better at this time and is ready to go home  Teressa Lower, NP 07/15/13 2101

## 2013-07-18 NOTE — ED Provider Notes (Signed)
Medical screening examination/treatment/procedure(s) were performed by non-physician practitioner and as supervising physician I was immediately available for consultation/collaboration.   Candyce Churn, MD 07/18/13 2120

## 2013-08-04 ENCOUNTER — Emergency Department (HOSPITAL_BASED_OUTPATIENT_CLINIC_OR_DEPARTMENT_OTHER): Payer: Medicaid Other

## 2013-08-04 ENCOUNTER — Encounter (HOSPITAL_BASED_OUTPATIENT_CLINIC_OR_DEPARTMENT_OTHER): Payer: Self-pay | Admitting: *Deleted

## 2013-08-04 ENCOUNTER — Emergency Department (HOSPITAL_BASED_OUTPATIENT_CLINIC_OR_DEPARTMENT_OTHER)
Admission: EM | Admit: 2013-08-04 | Discharge: 2013-08-04 | Disposition: A | Discharge status: Home / Self Care | Payer: Medicaid Other | Attending: Emergency Medicine | Admitting: Emergency Medicine

## 2013-08-04 DIAGNOSIS — S161XXA Strain of muscle, fascia and tendon at neck level, initial encounter: Secondary | ICD-10-CM

## 2013-08-04 DIAGNOSIS — G43909 Migraine, unspecified, not intractable, without status migrainosus: Secondary | ICD-10-CM | POA: Insufficient documentation

## 2013-08-04 DIAGNOSIS — Y9389 Activity, other specified: Secondary | ICD-10-CM | POA: Insufficient documentation

## 2013-08-04 DIAGNOSIS — S39012A Strain of muscle, fascia and tendon of lower back, initial encounter: Secondary | ICD-10-CM

## 2013-08-04 DIAGNOSIS — IMO0001 Reserved for inherently not codable concepts without codable children: Secondary | ICD-10-CM | POA: Insufficient documentation

## 2013-08-04 DIAGNOSIS — IMO0002 Reserved for concepts with insufficient information to code with codable children: Secondary | ICD-10-CM | POA: Insufficient documentation

## 2013-08-04 DIAGNOSIS — F17200 Nicotine dependence, unspecified, uncomplicated: Secondary | ICD-10-CM | POA: Insufficient documentation

## 2013-08-04 DIAGNOSIS — Z8679 Personal history of other diseases of the circulatory system: Secondary | ICD-10-CM | POA: Insufficient documentation

## 2013-08-04 DIAGNOSIS — S335XXA Sprain of ligaments of lumbar spine, initial encounter: Secondary | ICD-10-CM | POA: Insufficient documentation

## 2013-08-04 DIAGNOSIS — Z792 Long term (current) use of antibiotics: Secondary | ICD-10-CM | POA: Insufficient documentation

## 2013-08-04 DIAGNOSIS — Y92410 Unspecified street and highway as the place of occurrence of the external cause: Secondary | ICD-10-CM | POA: Insufficient documentation

## 2013-08-04 DIAGNOSIS — Z79899 Other long term (current) drug therapy: Secondary | ICD-10-CM | POA: Insufficient documentation

## 2013-08-04 DIAGNOSIS — S139XXA Sprain of joints and ligaments of unspecified parts of neck, initial encounter: Secondary | ICD-10-CM | POA: Insufficient documentation

## 2013-08-04 MED ORDER — HYDROCODONE-ACETAMINOPHEN 5-325 MG PO TABS
2.0000 | ORAL_TABLET | Freq: Once | ORAL | Status: AC
  Administered 2013-08-04: 2 via ORAL
  Filled 2013-08-04: qty 2

## 2013-08-04 MED ORDER — CYCLOBENZAPRINE HCL 10 MG PO TABS: 10.0000 mg | ORAL_TABLET | Freq: Three times a day (TID) | ORAL | Status: DC | PRN
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
## 2013-08-04 NOTE — ED Notes (Signed)
[employer] Officer Duggins at bedside speaking with pt.

## 2013-08-04 NOTE — ED Notes (Signed)
MD at bedside. 

## 2013-08-28 ENCOUNTER — Emergency Department (HOSPITAL_BASED_OUTPATIENT_CLINIC_OR_DEPARTMENT_OTHER)
Admission: EM | Admit: 2013-08-28 | Discharge: 2013-08-28 | Disposition: A | Discharge status: Home / Self Care | Payer: Medicaid Other | Attending: Emergency Medicine | Admitting: Emergency Medicine

## 2013-08-28 ENCOUNTER — Encounter (HOSPITAL_BASED_OUTPATIENT_CLINIC_OR_DEPARTMENT_OTHER): Payer: Self-pay

## 2013-08-28 DIAGNOSIS — N760 Acute vaginitis: Secondary | ICD-10-CM | POA: Insufficient documentation

## 2013-08-28 DIAGNOSIS — Z8679 Personal history of other diseases of the circulatory system: Secondary | ICD-10-CM | POA: Insufficient documentation

## 2013-08-28 DIAGNOSIS — IMO0001 Reserved for inherently not codable concepts without codable children: Secondary | ICD-10-CM | POA: Insufficient documentation

## 2013-08-28 DIAGNOSIS — R110 Nausea: Secondary | ICD-10-CM | POA: Insufficient documentation

## 2013-08-28 DIAGNOSIS — Z79899 Other long term (current) drug therapy: Secondary | ICD-10-CM | POA: Insufficient documentation

## 2013-08-28 DIAGNOSIS — A499 Bacterial infection, unspecified: Secondary | ICD-10-CM | POA: Insufficient documentation

## 2013-08-28 DIAGNOSIS — Z3202 Encounter for pregnancy test, result negative: Secondary | ICD-10-CM | POA: Insufficient documentation

## 2013-08-28 DIAGNOSIS — N12 Tubulo-interstitial nephritis, not specified as acute or chronic: Secondary | ICD-10-CM | POA: Insufficient documentation

## 2013-08-28 DIAGNOSIS — F17200 Nicotine dependence, unspecified, uncomplicated: Secondary | ICD-10-CM | POA: Insufficient documentation

## 2013-08-28 DIAGNOSIS — G43909 Migraine, unspecified, not intractable, without status migrainosus: Secondary | ICD-10-CM | POA: Insufficient documentation

## 2013-08-28 DIAGNOSIS — B9689 Other specified bacterial agents as the cause of diseases classified elsewhere: Secondary | ICD-10-CM | POA: Insufficient documentation

## 2013-08-28 LAB — URINALYSIS, ROUTINE W REFLEX MICROSCOPIC
Ketones, ur: 15 mg/dL — AB
Nitrite: NEGATIVE
Specific Gravity, Urine: 1.031 — ABNORMAL HIGH (ref 1.005–1.030)
pH: 6 (ref 5.0–8.0)

## 2013-08-28 LAB — URINE MICROSCOPIC-ADD ON

## 2013-08-28 LAB — WET PREP, GENITAL: Yeast Wet Prep HPF POC: NONE SEEN

## 2013-08-28 LAB — GLUCOSE, CAPILLARY

## 2013-08-28 MED ORDER — CIPROFLOXACIN HCL 500 MG PO TABS: 500.0000 mg | ORAL_TABLET | Freq: Two times a day (BID) | ORAL | Status: DC

## 2013-08-28 MED ORDER — METRONIDAZOLE 500 MG PO TABS: 500.0000 mg | ORAL_TABLET | Freq: Two times a day (BID) | ORAL | Status: DC

## 2013-08-28 NOTE — ED Notes (Signed)
Pelvic cart is at the bedside set up and ready for the doctor to use. 

## 2013-08-28 NOTE — ED Provider Notes (Signed)
CSN: [id number]     Arrival date & time 08/28/13  1538 History   First MD Initiated Contact with Patient 08/28/13 1549     Chief Complaint  Patient presents with  . Dysuria   (Consider location/radiation/quality/duration/timing/severity/associated sxs/prior Treatment) HPI Comments: Patient presents with two-day history of back pain, urinary hesitancy, dysuria, a temperature to 99.875F, nausea but no vomiting. She denies vaginal bleeding or discharge. No history of kidney stones, she has been checked for them in the past when she lived in [place]. Onset of symptoms gradual. Course is constant. Nothing makes symptoms better or worse. Patient does not feel like she is at risk for STD.   Patient is a 32 y.o. female presenting with dysuria. The history is provided by the patient.  Dysuria Associated symptoms: abdominal pain and nausea   Associated symptoms: no fever (Tmax = 99.875F), no flank pain, no vaginal discharge and no vomiting     Past Medical History  Diagnosis Date  . Migraine   . Fibromyalgia   . Raynaud's syndrome    Past Surgical History  Procedure Laterality Date  . Shoulder surgery     No family history on file. History  Substance Use Topics  . Smoking status: Current Every Day Smoker -- 0.50 packs/day    Types: Cigarettes  . Smokeless tobacco: Never Used  . Alcohol Use: Yes     Comment: rare   OB History   Grav Para Term Preterm Abortions TAB SAB Ect Mult Living                 Review of Systems  Constitutional: Positive for chills. Negative for fever (Tmax = 99.875F).  HENT: Negative for sore throat and rhinorrhea.   Eyes: Negative for redness.  Respiratory: Negative for cough.   Cardiovascular: Negative for chest pain.  Gastrointestinal: Positive for nausea and abdominal pain. Negative for vomiting and diarrhea.  Genitourinary: Positive for dysuria. Negative for frequency, hematuria, flank pain, decreased urine volume, vaginal bleeding, vaginal discharge and  pelvic pain.  Musculoskeletal: Negative for myalgias.  Skin: Negative for rash.  Neurological: Negative for headaches.    Allergies  Lorazepam and Sulfa antibiotics  Home Medications   Current Outpatient Rx  Name  Route  Sig  Dispense  Refill  . cephALEXin (KEFLEX) 500 MG capsule   Oral   Take 1 capsule (500 mg total) by mouth 4 (four) times daily.   40 capsule   0   . Citalopram Hydrobromide (CELEXA PO)   Oral   Take by mouth.         . clonazePAM (KLONOPIN) 1 MG tablet   Oral   Take 1 mg by mouth 2 (two) times daily as needed.         . cyclobenzaprine (FLEXERIL) 10 MG tablet   Oral   Take 1 tablet (10 mg total) by mouth 3 (three) times daily as needed for muscle spasms.   30 tablet   0   . HYDROcodone-acetaminophen (NORCO/VICODIN) 5-325 MG per tablet   Oral   Take 1 tablet by mouth every 4 (four) hours as needed for pain.   10 tablet   0   . Topiramate (TOPAMAX PO)   Oral   Take by mouth.         . traMADol (ULTRAM) 50 MG tablet   Oral   Take 50 mg by mouth every 6 (six) hours as needed.         . zolpidem (AMBIEN) 5 MG tablet  Oral   Take 1 tablet (5 mg total) by mouth at bedtime as needed for sleep.   3 tablet   0    BP 114/78  Pulse 128  Temp(Src) 99.7 F (37.6 C) (Oral)  Resp 20  SpO2 99%  LMP 08/12/2013 Physical Exam  Nursing note and vitals reviewed. Constitutional: She appears well-developed and well-nourished.  HENT:  Head: Normocephalic and atraumatic.  Eyes: Conjunctivae are normal. Right eye exhibits no discharge. Left eye exhibits no discharge.  Neck: Normal range of motion. Neck supple.  Cardiovascular: Normal rate, regular rhythm and normal heart sounds.   Pulmonary/Chest: Effort normal and breath sounds normal.  Abdominal: Soft. Bowel sounds are normal. There is tenderness in the suprapubic area. There is CVA tenderness. There is no rigidity, no rebound, no guarding, no tenderness at McBurney's point and negative  Murphy's sign.  Genitourinary: Uterus is not enlarged. Cervix exhibits no motion tenderness, no discharge and no friability. Right adnexum displays no mass and no tenderness. Left adnexum displays no mass and no tenderness. No tenderness around the vagina. Vaginal discharge (mild, white) found.  Mild suprapubic tenderness, no cervical motion tenderness, no adnexal tenderness.   Neurological: She is alert.  Skin: Skin is warm and dry.  Psychiatric: She has a normal mood and affect.    ED Course  Procedures (including critical care time) Labs Review Labs Reviewed  WET PREP, GENITAL - Abnormal; Notable for the following:    Clue Cells Wet Prep HPF POC MODERATE (*)    WBC, Wet Prep HPF POC MANY (*)    All other components within normal limits  URINALYSIS, ROUTINE W REFLEX MICROSCOPIC - Abnormal; Notable for the following:    Specific Gravity, Urine 1.031 (*)    Bilirubin Urine SMALL (*)    Ketones, ur 15 (*)    Leukocytes, UA TRACE (*)    All other components within normal limits  URINE MICROSCOPIC-ADD ON - Abnormal; Notable for the following:    Squamous Epithelial / LPF FEW (*)    Bacteria, UA MANY (*)    Crystals CA OXALATE CRYSTALS (*)    All other components within normal limits  GC/CHLAMYDIA PROBE AMP  PREGNANCY, URINE  GLUCOSE, CAPILLARY   Imaging Review No results found.  Patient seen and examined. Work-up initiated. D/w Dr. Fredderick Phenix. Will perform pelvic given unclear UA results.    Vital signs reviewed and are as follows: Filed Vitals:   08/28/13 1624  BP:   Pulse: 115  Temp:   Resp:   BP 114/78  Pulse 115  Temp(Src) 99.7 F (37.6 C) (Oral)  Resp 20  SpO2 99%  LMP 08/12/2013   Pelvic exam performed with nurse tech chaperone.   Patient informed of results.  5:19 PM The patient was urged to return to the Emergency Department immediately with worsening of current symptoms, worsening abdominal pain, persistent vomiting, blood noted in stools, fever, or any other  concerns. The patient verbalized understanding.    MDM   1. Pyelonephritis   2. Bacterial vaginosis    Consolation of symptoms is vague, however concern for early pyelonephritis given back pain, urinary symptoms. Urine cx pending. Neg urine preg. Doubt renal colic given description of pain, no blood in UA. No adnexal tenderness on pelvic exam to suggest torsion for TOA. No cervical motion tenderness or discharge to suggest PID. Given mild vaginal discharge and clue cells, will also treat for bacterial vaginosis. Patient appears well, nontoxic. She's afebrile. Tachycardia is improving. Feel patient can  be safely treated at home. She's given strict return instructions if symptoms worsen, high fever develops, or she has any other concerns.  Renne Crigler, PA-C 08/28/13 1721

## 2013-08-28 NOTE — ED Notes (Signed)
Patient here with dysuria and hesitancy x 2 days. Reports lower back pain for same. Denies nausea

## 2013-08-28 NOTE — ED Notes (Signed)
rx x 2 for flagyl and cipro

## 2013-08-29 NOTE — ED Provider Notes (Signed)
Medical screening examination/treatment/procedure(s) were performed by non-physician practitioner and as supervising physician I was immediately available for consultation/collaboration.   Dois Juarbe, MD 08/29/13 0013 

## 2013-08-30 LAB — GC/CHLAMYDIA PROBE AMP: CT Probe RNA: UNDETERMINED

## 2013-10-14 ENCOUNTER — Emergency Department (HOSPITAL_BASED_OUTPATIENT_CLINIC_OR_DEPARTMENT_OTHER): Payer: Medicaid Other

## 2013-10-14 ENCOUNTER — Emergency Department (HOSPITAL_BASED_OUTPATIENT_CLINIC_OR_DEPARTMENT_OTHER)
Admission: EM | Admit: 2013-10-14 | Discharge: 2013-10-14 | Disposition: A | Discharge status: Home / Self Care | Payer: Medicaid Other | Attending: Emergency Medicine | Admitting: Emergency Medicine

## 2013-10-14 ENCOUNTER — Encounter (HOSPITAL_BASED_OUTPATIENT_CLINIC_OR_DEPARTMENT_OTHER): Payer: Self-pay | Admitting: Emergency Medicine

## 2013-10-14 DIAGNOSIS — J3489 Other specified disorders of nose and nasal sinuses: Secondary | ICD-10-CM | POA: Insufficient documentation

## 2013-10-14 DIAGNOSIS — IMO0001 Reserved for inherently not codable concepts without codable children: Secondary | ICD-10-CM | POA: Insufficient documentation

## 2013-10-14 DIAGNOSIS — F17200 Nicotine dependence, unspecified, uncomplicated: Secondary | ICD-10-CM | POA: Insufficient documentation

## 2013-10-14 DIAGNOSIS — J029 Acute pharyngitis, unspecified: Secondary | ICD-10-CM | POA: Insufficient documentation

## 2013-10-14 DIAGNOSIS — R51 Headache: Secondary | ICD-10-CM | POA: Insufficient documentation

## 2013-10-14 DIAGNOSIS — Z79899 Other long term (current) drug therapy: Secondary | ICD-10-CM | POA: Insufficient documentation

## 2013-10-14 DIAGNOSIS — W010XXA Fall on same level from slipping, tripping and stumbling without subsequent striking against object, initial encounter: Secondary | ICD-10-CM | POA: Insufficient documentation

## 2013-10-14 DIAGNOSIS — S93402A Sprain of unspecified ligament of left ankle, initial encounter: Secondary | ICD-10-CM

## 2013-10-14 DIAGNOSIS — S93409A Sprain of unspecified ligament of unspecified ankle, initial encounter: Secondary | ICD-10-CM | POA: Insufficient documentation

## 2013-10-14 DIAGNOSIS — Z792 Long term (current) use of antibiotics: Secondary | ICD-10-CM | POA: Insufficient documentation

## 2013-10-14 DIAGNOSIS — Y929 Unspecified place or not applicable: Secondary | ICD-10-CM | POA: Insufficient documentation

## 2013-10-14 DIAGNOSIS — Z8679 Personal history of other diseases of the circulatory system: Secondary | ICD-10-CM | POA: Insufficient documentation

## 2013-10-14 DIAGNOSIS — G43909 Migraine, unspecified, not intractable, without status migrainosus: Secondary | ICD-10-CM | POA: Insufficient documentation

## 2013-10-14 DIAGNOSIS — Y9301 Activity, walking, marching and hiking: Secondary | ICD-10-CM | POA: Insufficient documentation

## 2013-10-14 DIAGNOSIS — S8391XA Sprain of unspecified site of right knee, initial encounter: Secondary | ICD-10-CM

## 2013-10-14 DIAGNOSIS — IMO0002 Reserved for concepts with insufficient information to code with codable children: Secondary | ICD-10-CM | POA: Insufficient documentation

## 2013-10-14 NOTE — ED Notes (Signed)
Patient also given slipper footies.

## 2013-10-14 NOTE — ED Provider Notes (Signed)
I saw and evaluated the patient, reviewed the resident's note and I agree with the findings and plan. Patient is a 32 year old female presents to the emergency department after a fall. She was walking in high heels on gravel when she stumbled and fell injuring her right knee and left ankle. She is having pain in these areas and difficulty walking due to this.  On exam, vitals are stable and she is afebrile. The right knee is without effusion. She has good range of motion without crepitus. There is no laxity with varus or valgus stress and Lachman's test is negative. The left ankle appears grossly normal with no swelling. There is tenderness to palpation inferior to the lateral malleolus and over the talofibular ligament. The joint is otherwise stable. There is no proximal fibular tenderness and no fifth metatarsal tenderness.  These both appear to be sprains and will be treated with rest, ice, elevation, and anti-inflammatories. If she's not improving in the next week she is to followup with her primary Dr. to be reevaluated.     Geoffery Lyons, MD 10/14/13 1003

## 2013-10-14 NOTE — ED Notes (Signed)
Tripped on gravel driveway having pain in right knee and left ankle.

## 2013-10-14 NOTE — ED Provider Notes (Signed)
CSN: [id number]     Arrival date & time 10/14/13  9562 History   First MD Initiated Contact with Patient 10/14/13 0830     Chief Complaint  Patient presents with  . Knee Pain  . Ankle Pain   (Consider location/radiation/quality/duration/timing/severity/associated sxs/prior Treatment) Patient is a 32 y.o. female presenting with knee pain and ankle pain.  Knee Pain Associated symptoms: no fever   Ankle Pain Associated symptoms: no fever     32 year old female here with left ankle and right knee pain after a fall on gravel from stating that this morning. She states she was wearing four-inch heels and her left ankle twisted and she went down falling hardest on her R knee. She states that she could bear weight on her right leg although there is severe pain, but could not bear weight on her left ankle. Her boyfriend helped her to the car and she is able to take her child to school and she came to the ED for evaluation. The side she's had congestion, headache, and sore throat for about a week and a few episodes of loose stool one day ago. She is able tolerate by mouth food and fluid easily.  Past Medical History  Diagnosis Date  . Migraine   . Fibromyalgia   . Raynaud's syndrome    Past Surgical History  Procedure Laterality Date  . Shoulder surgery     History reviewed. No pertinent family history. History  Substance Use Topics  . Smoking status: Current Every Day Smoker -- 0.50 packs/day    Types: Cigarettes  . Smokeless tobacco: Never Used  . Alcohol Use: Yes     Comment: rare   OB History   Grav Para Term Preterm Abortions TAB SAB Ect Mult Living                 Review of Systems  Constitutional: Negative for fever, chills and appetite change.  HENT: Positive for congestion and sore throat.   Respiratory: Negative for cough and shortness of breath.   Cardiovascular: Negative for chest pain.  Gastrointestinal: Negative for abdominal pain.  Genitourinary: Negative for  dysuria.  Musculoskeletal: Positive for arthralgias. Negative for joint swelling.  Skin: Positive for wound.  Neurological: Positive for headaches.  All other systems reviewed and are negative.    Allergies  Lorazepam and Sulfa antibiotics  Home Medications   Current Outpatient Rx  Name  Route  Sig  Dispense  Refill  . cephALEXin (KEFLEX) 500 MG capsule   Oral   Take 1 capsule (500 mg total) by mouth 4 (four) times daily.   40 capsule   0   . ciprofloxacin (CIPRO) 500 MG tablet   Oral   Take 1 tablet (500 mg total) by mouth 2 (two) times daily.   20 tablet   0   . Citalopram Hydrobromide (CELEXA PO)   Oral   Take by mouth.         . clonazePAM (KLONOPIN) 1 MG tablet   Oral   Take 1 mg by mouth 2 (two) times daily as needed.         . cyclobenzaprine (FLEXERIL) 10 MG tablet   Oral   Take 1 tablet (10 mg total) by mouth 3 (three) times daily as needed for muscle spasms.   30 tablet   0   . HYDROcodone-acetaminophen (NORCO/VICODIN) 5-325 MG per tablet   Oral   Take 1 tablet by mouth every 4 (four) hours as needed for pain.  10 tablet   0   . metroNIDAZOLE (FLAGYL) 500 MG tablet   Oral   Take 1 tablet (500 mg total) by mouth 2 (two) times daily.   14 tablet   0   . Topiramate (TOPAMAX PO)   Oral   Take by mouth.         . traMADol (ULTRAM) 50 MG tablet   Oral   Take 50 mg by mouth every 6 (six) hours as needed.         . zolpidem (AMBIEN) 5 MG tablet   Oral   Take 1 tablet (5 mg total) by mouth at bedtime as needed for sleep.   3 tablet   0    BP 118/81  Pulse 102  Temp(Src) 98.3 F (36.8 C) (Oral)  Resp 16  Ht 5\' 4"  (1.626 m)  Wt 104 lb (47.174 kg)  BMI 17.84 kg/m2  SpO2 100%  LMP 09/29/2013 Physical Exam  Constitutional: She is oriented to person, place, and time. She appears well-developed and well-nourished. No distress.  HENT:  Head: Normocephalic and atraumatic.  Eyes: EOM are normal. Pupils are equal, round, and reactive to  light.  Neck: Neck supple.  Cardiovascular: Normal rate, regular rhythm and normal heart sounds.   No murmur heard. Pulmonary/Chest: Effort normal and breath sounds normal.  Abdominal: Soft. Bowel sounds are normal. There is no tenderness. There is no guarding.  Musculoskeletal: She exhibits no edema.       Right knee: She exhibits normal range of motion, no swelling, no effusion, no deformity, no LCL laxity, normal patellar mobility and no bony tenderness. Tenderness found. Patellar tendon tenderness noted. No medial joint line and no lateral joint line tenderness noted.       Left ankle: Tenderness. Lateral malleolus and AITFL tenderness found. No medial malleolus, no CF ligament, no posterior TFL, no head of 5th metatarsal and no proximal fibula tenderness found. Achilles tendon exhibits no pain.  Neurological: She is alert and oriented to person, place, and time.  Skin: Skin is warm and dry. She is not diaphoretic.  Psychiatric: She has a normal mood and affect.    ED Course  Procedures (including critical care time) Labs Review Labs Reviewed - No data to display Imaging Review Dg Tibia/fibula Left  10/14/2013   CLINICAL DATA:  Fall, left lateral ankle and infrapatellar knee pain  EXAM: LEFT TIBIA AND FIBULA - 2 VIEW  COMPARISON:  Concurrently obtained radiographs of the knee and ankle  FINDINGS: There is no evidence of fracture or other focal bone lesions. Soft tissues are unremarkable.  IMPRESSION: Negative.   Electronically Signed   By: Malachy Moan M.D.   On: 10/14/2013 09:01   Dg Ankle Complete Left  10/14/2013   CLINICAL DATA:  Left lateral ankle pain  EXAM: LEFT ANKLE COMPLETE - 3+ VIEW  COMPARISON:  None.  FINDINGS: Left ankle demonstrates no fracture or dislocation. The ankle mortise is intact. The soft tissues are normal.  IMPRESSION: No acute osseous injury of the left ankle.   Electronically Signed   By: Elige Ko   On: 10/14/2013 08:58   Dg Knee Complete 4 Views  Right  10/14/2013   CLINICAL DATA:  Fall.  Pain.  EXAM: RIGHT KNEE - COMPLETE 4+ VIEW  COMPARISON:  None.  FINDINGS: There is no evidence of fracture, dislocation, or joint effusion. There is no evidence of arthropathy or other focal bone abnormality. Soft tissues are unremarkable. Tiny exostosis along the medial right  distal femoral metaphysis cannot be excluded.  IMPRESSION: No evidence of fracture dislocation.   Electronically Signed   By: Maisie Fus  Register   On: 10/14/2013 09:00    EKG Interpretation   None       MDM   1. Sprain of ankle, left, initial encounter   2. Sprain, knee, right, initial encounter     32 year old female here after a mechanical fall this morning.   X-rays of the fibula, tibia and ankle are negative for fracture. We'll treat as left ankle and right knee sprain with rest, ice, compression, and elevation. His cast use of NSAIDs the patient, recommend followup with PCP or return for worsening symptoms.  Murtis Sink, MD [facility] Resident, PGY-2 10/14/2013, 9:13 AM     Elenora Gamma, MD 10/14/13 [phone]

## 2013-11-28 ENCOUNTER — Encounter (HOSPITAL_BASED_OUTPATIENT_CLINIC_OR_DEPARTMENT_OTHER): Payer: Self-pay | Admitting: Emergency Medicine

## 2013-11-28 ENCOUNTER — Emergency Department (HOSPITAL_BASED_OUTPATIENT_CLINIC_OR_DEPARTMENT_OTHER): Payer: Medicaid Other

## 2013-11-28 ENCOUNTER — Emergency Department (HOSPITAL_BASED_OUTPATIENT_CLINIC_OR_DEPARTMENT_OTHER)
Admission: EM | Admit: 2013-11-28 | Discharge: 2013-11-28 | Disposition: A | Discharge status: Home / Self Care | Payer: Medicaid Other | Attending: Emergency Medicine | Admitting: Emergency Medicine

## 2013-11-28 DIAGNOSIS — G43909 Migraine, unspecified, not intractable, without status migrainosus: Secondary | ICD-10-CM | POA: Insufficient documentation

## 2013-11-28 DIAGNOSIS — R509 Fever, unspecified: Secondary | ICD-10-CM | POA: Insufficient documentation

## 2013-11-28 DIAGNOSIS — Z79899 Other long term (current) drug therapy: Secondary | ICD-10-CM | POA: Insufficient documentation

## 2013-11-28 DIAGNOSIS — F17200 Nicotine dependence, unspecified, uncomplicated: Secondary | ICD-10-CM | POA: Insufficient documentation

## 2013-11-28 DIAGNOSIS — Z8679 Personal history of other diseases of the circulatory system: Secondary | ICD-10-CM | POA: Insufficient documentation

## 2013-11-28 DIAGNOSIS — IMO0001 Reserved for inherently not codable concepts without codable children: Secondary | ICD-10-CM | POA: Insufficient documentation

## 2013-11-28 DIAGNOSIS — J40 Bronchitis, not specified as acute or chronic: Secondary | ICD-10-CM

## 2013-11-28 DIAGNOSIS — R5381 Other malaise: Secondary | ICD-10-CM | POA: Insufficient documentation

## 2013-11-28 DIAGNOSIS — J209 Acute bronchitis, unspecified: Secondary | ICD-10-CM | POA: Insufficient documentation

## 2013-11-28 DIAGNOSIS — J029 Acute pharyngitis, unspecified: Secondary | ICD-10-CM | POA: Insufficient documentation

## 2013-11-28 MED ORDER — DEXAMETHASONE SODIUM PHOSPHATE 4 MG/ML IJ SOLN
8.0000 mg | Freq: Once | INTRAMUSCULAR | Status: AC
  Administered 2013-11-28: 8 mg via INTRAMUSCULAR
  Filled 2013-11-28: qty 2

## 2013-11-28 MED ORDER — GUAIFENESIN-CODEINE 100-10 MG/5ML PO SOLN
5.0000 mL | Freq: Once | ORAL | Status: AC
  Administered 2013-11-28: 5 mL via ORAL
  Filled 2013-11-28: qty 5

## 2013-11-28 MED ORDER — AZITHROMYCIN 250 MG PO TABS: 250.0000 mg | ORAL_TABLET | Freq: Every day | ORAL | Status: DC

## 2013-11-28 MED ORDER — GUAIFENESIN-CODEINE 100-10 MG/5ML PO SYRP: 5.0000 mL | ORAL_SOLUTION | Freq: Three times a day (TID) | ORAL | Status: DC | PRN

## 2013-11-28 NOTE — ED Notes (Signed)
Patient here with cough and congestion with general fatigue x 2 weeks. Saw her primary MD and tested negative for strep and the flu 2 weeks ago. Reports that she is using inhaler with no relief

## 2013-11-28 NOTE — ED Provider Notes (Signed)
CSN: [id number]     Arrival date & time 11/28/13  1156 History   First MD Initiated Contact with Patient 11/28/13 1433     Chief Complaint  Patient presents with  . Cough  . Nasal Congestion   (Consider location/radiation/quality/duration/timing/severity/associated sxs/prior Treatment) Patient is a 32 y.o. female presenting with cough. The history is provided by the patient.  Cough Cough characteristics:  Productive Sputum characteristics:  Green Severity:  Moderate Onset quality:  Gradual Duration:  2 weeks Timing:  Sporadic Progression:  Unchanged Chronicity:  New Smoker: yes   Context: sick contacts   Relieved by:  Nothing Worsened by:  Nothing tried Ineffective treatments:  Beta-agonist inhaler Associated symptoms: fever, myalgias, rhinorrhea, sinus congestion, sore throat and wheezing   Associated symptoms: no ear pain, no headaches and no rash    Nicole Mccoy is a 32 y.o. female who presents to the ED with cough and congestion and generalized fatigue x 2 weeks. She was evaluated by her PCP and tested negative for strep and flu 2 weeks ago. She is using her inhaler without relief.  Past Medical History  Diagnosis Date  . Migraine   . Fibromyalgia   . Raynaud's syndrome    Past Surgical History  Procedure Laterality Date  . Shoulder surgery     No family history on file. History  Substance Use Topics  . Smoking status: Current Every Day Smoker -- 0.50 packs/day    Types: Cigarettes  . Smokeless tobacco: Never Used  . Alcohol Use: Yes     Comment: rare   OB History   Grav Para Term Preterm Abortions TAB SAB Ect Mult Living                 Review of Systems  Constitutional: Positive for fever.  HENT: Positive for rhinorrhea, sinus pressure and sore throat. Negative for ear pain.   Respiratory: Positive for cough and wheezing.   Gastrointestinal: Negative for nausea, vomiting and abdominal pain.  Genitourinary: Negative for dysuria and urgency.   Musculoskeletal: Positive for myalgias.  Skin: Negative for rash.  Neurological: Negative for seizures and headaches.    Allergies  Lorazepam and Sulfa antibiotics  Home Medications   Current Outpatient Rx  Name  Route  Sig  Dispense  Refill  . Citalopram Hydrobromide (CELEXA PO)   Oral   Take by mouth.         . clonazePAM (KLONOPIN) 1 MG tablet   Oral   Take 1 mg by mouth 2 (two) times daily as needed.         . cyclobenzaprine (FLEXERIL) 10 MG tablet   Oral   Take 1 tablet (10 mg total) by mouth 3 (three) times daily as needed for muscle spasms.   30 tablet   0   . HYDROcodone-acetaminophen (NORCO/VICODIN) 5-325 MG per tablet   Oral   Take 1 tablet by mouth every 4 (four) hours as needed for pain.   10 tablet   0   . Topiramate (TOPAMAX PO)   Oral   Take by mouth.         . traMADol (ULTRAM) 50 MG tablet   Oral   Take 50 mg by mouth every 6 (six) hours as needed.         . zolpidem (AMBIEN) 5 MG tablet   Oral   Take 1 tablet (5 mg total) by mouth at bedtime as needed for sleep.   3 tablet   0  BP 114/72  Pulse 118  Temp(Src) 99.3 F (37.4 C) (Oral)  Resp 22  Ht 5\' 4"  (1.626 m)  Wt 110 lb (49.896 kg)  BMI 18.87 kg/m2  SpO2 100% Physical Exam  Nursing note and vitals reviewed. Constitutional: She is oriented to person, place, and time. She appears well-developed and well-nourished. No distress.  HENT:  Head: Normocephalic and atraumatic.  Right Ear: Tympanic membrane normal.  Left Ear: Tympanic membrane normal.  Nose: Mucosal edema and rhinorrhea present.  Mouth/Throat: Uvula is midline and mucous membranes are normal. Posterior oropharyngeal erythema present.  Eyes: EOM are normal.  Neck: Neck supple.  Cardiovascular: Tachycardia present.   Pulmonary/Chest: Effort normal. She has decreased breath sounds. She has wheezes.  Abdominal: Soft. There is no tenderness.  Musculoskeletal: Normal range of motion.  Neurological: She is alert  and oriented to person, place, and time. No cranial nerve deficit.  Skin: Skin is warm and dry.  Psychiatric: She has a normal mood and affect. Her behavior is normal.     Dg Chest 2 View  11/28/2013   CLINICAL DATA:  Cough and congestion.  EXAM: CHEST  2 VIEW  COMPARISON:  None.  FINDINGS: Heart size and mediastinal contours are within normal limits. Both lungs are clear. Visualized skeletal structures are unremarkable.  IMPRESSION: Negative exam.   Electronically Signed   By: Drusilla Kanner M.D.   On: 11/28/2013 15:49    ED Course  Procedures  MDM  32 y.o. female with asthmatic bronchitis that has been persistent x 2 weeks. Will treat with z-pak and cough medication. She will follow up with her PCP or return here for worsening symptoms.  I have reviewed this patient's vital signs, nurses notes, appropriate imaging and discussed findings with the patient. She voices understanding. Stable for discharge without any immediate complications. O2 SAT 100% on R/A.   Medication List    TAKE these medications       azithromycin 250 MG tablet  Commonly known as:  ZITHROMAX  Take 1 tablet (250 mg total) by mouth daily. Take first 2 tablets together, then 1 every day until finished.     guaiFENesin-codeine 100-10 MG/5ML syrup  Commonly known as:  ROBITUSSIN AC  Take 5 mLs by mouth 3 (three) times daily as needed for cough.      ASK your doctor about these medications       CELEXA PO  Take by mouth.     clonazePAM 1 MG tablet  Commonly known as:  KLONOPIN  Take 1 mg by mouth 2 (two) times daily as needed.     cyclobenzaprine 10 MG tablet  Commonly known as:  FLEXERIL  Take 1 tablet (10 mg total) by mouth 3 (three) times daily as needed for muscle spasms.     HYDROcodone-acetaminophen 5-325 MG per tablet  Commonly known as:  NORCO/VICODIN  Take 1 tablet by mouth every 4 (four) hours as needed for pain.     TOPAMAX PO  Take by mouth.     traMADol 50 MG tablet  Commonly known as:   ULTRAM  Take 50 mg by mouth every 6 (six) hours as needed.     zolpidem 5 MG tablet  Commonly known as:  AMBIEN  Take 1 tablet (5 mg total) by mouth at bedtime as needed for sleep.             Janne Napoleon, [place] 12/02/13 1229

## 2013-12-07 NOTE — ED Provider Notes (Signed)
History/physical exam/procedure(s) were performed by non-physician practitioner and as supervising physician I was immediately available for consultation/collaboration. I have reviewed all notes and am in agreement with care and plan.   Stanley Helmuth S Thurza Kwiecinski, MD 12/07/13 1458 

## 2014-03-28 ENCOUNTER — Emergency Department (HOSPITAL_BASED_OUTPATIENT_CLINIC_OR_DEPARTMENT_OTHER)
Admission: EM | Admit: 2014-03-28 | Discharge: 2014-03-28 | Disposition: A | Discharge status: Home / Self Care | Payer: Medicaid Other | Attending: Emergency Medicine | Admitting: Emergency Medicine

## 2014-03-28 ENCOUNTER — Emergency Department (HOSPITAL_BASED_OUTPATIENT_CLINIC_OR_DEPARTMENT_OTHER): Payer: Medicaid Other

## 2014-03-28 ENCOUNTER — Encounter (HOSPITAL_BASED_OUTPATIENT_CLINIC_OR_DEPARTMENT_OTHER): Payer: Self-pay | Admitting: Emergency Medicine

## 2014-03-28 DIAGNOSIS — G43909 Migraine, unspecified, not intractable, without status migrainosus: Secondary | ICD-10-CM | POA: Insufficient documentation

## 2014-03-28 DIAGNOSIS — R51 Headache: Secondary | ICD-10-CM | POA: Insufficient documentation

## 2014-03-28 DIAGNOSIS — J40 Bronchitis, not specified as acute or chronic: Secondary | ICD-10-CM

## 2014-03-28 DIAGNOSIS — Z8679 Personal history of other diseases of the circulatory system: Secondary | ICD-10-CM | POA: Insufficient documentation

## 2014-03-28 DIAGNOSIS — R1110 Vomiting, unspecified: Secondary | ICD-10-CM | POA: Insufficient documentation

## 2014-03-28 DIAGNOSIS — IMO0001 Reserved for inherently not codable concepts without codable children: Secondary | ICD-10-CM | POA: Insufficient documentation

## 2014-03-28 DIAGNOSIS — J029 Acute pharyngitis, unspecified: Secondary | ICD-10-CM

## 2014-03-28 DIAGNOSIS — F17200 Nicotine dependence, unspecified, uncomplicated: Secondary | ICD-10-CM | POA: Insufficient documentation

## 2014-03-28 DIAGNOSIS — Z79899 Other long term (current) drug therapy: Secondary | ICD-10-CM | POA: Insufficient documentation

## 2014-03-28 DIAGNOSIS — J209 Acute bronchitis, unspecified: Secondary | ICD-10-CM | POA: Insufficient documentation

## 2014-03-28 LAB — RAPID STREP SCREEN (MED CTR MEBANE ONLY): Streptococcus, Group A Screen (Direct): NEGATIVE

## 2014-03-28 MED ORDER — ONDANSETRON 4 MG PO TBDP
4.0000 mg | ORAL_TABLET | Freq: Once | ORAL | Status: AC
Start: 2014-03-28 — End: 2014-03-28
  Administered 2014-03-28: 4 mg via ORAL
  Filled 2014-03-28: qty 1

## 2014-03-28 MED ORDER — AZITHROMYCIN 250 MG PO TABS: 250.0000 mg | ORAL_TABLET | Freq: Every day | ORAL | Status: DC

## 2014-03-28 MED ORDER — ONDANSETRON 4 MG PO TBDP: 4.0000 mg | ORAL_TABLET | Freq: Three times a day (TID) | ORAL | Status: DC | PRN

## 2014-03-28 NOTE — ED Notes (Signed)
Pt c/o nasal congestion, body aches x 10 days and n/v/d x 2 days. Pt sts she has taken phenergan and immodium for same.

## 2014-03-28 NOTE — ED Provider Notes (Signed)
Medical screening examination/treatment/procedure(s) were performed by non-physician practitioner and as supervising physician I was immediately available for consultation/collaboration.   EKG Interpretation None        Charles B. Sheldon, MD 03/28/14 1504 

## 2014-03-28 NOTE — ED Provider Notes (Signed)
CSN: [id number]     Arrival date & time 03/28/14  [id number] History   First MD Initiated Contact with Patient 03/28/14 [id number]     Chief Complaint  Patient presents with  . Nasal Congestion  . Generalized Body Aches     (Consider location/radiation/quality/duration/timing/severity/associated sxs/prior Treatment) Patient is a 33 y.o. female presenting with cough. The history is provided by the patient. No language interpreter was used.  Cough Cough characteristics:  Productive Sputum characteristics:  Bloody Severity:  Moderate Onset quality:  Gradual Timing:  Constant Progression:  Worsening Chronicity:  New Smoker: no   Relieved by:  Nothing Worsened by:  Nothing tried Ineffective treatments:  None tried Associated symptoms: fever, headaches and sore throat   Risk factors: recent infection     Past Medical History  Diagnosis Date  . Migraine   . Fibromyalgia   . Raynaud's syndrome    Past Surgical History  Procedure Laterality Date  . Shoulder surgery     No family history on file. History  Substance Use Topics  . Smoking status: Current Every Day Smoker -- 0.50 packs/day    Types: Cigarettes  . Smokeless tobacco: Never Used  . Alcohol Use: Yes     Comment: rare   OB History   Grav Para Term Preterm Abortions TAB SAB Ect Mult Living                 Review of Systems  Constitutional: Positive for fever.  HENT: Positive for sore throat.   Respiratory: Positive for cough.   Neurological: Positive for headaches.  All other systems reviewed and are negative.     Allergies  Lorazepam and Sulfa antibiotics  Home Medications   Prior to Admission medications   Medication Sig Start Date End Date Taking? Authorizing Provider  azithromycin (ZITHROMAX) 250 MG tablet Take 1 tablet (250 mg total) by mouth daily. Take first 2 tablets together, then 1 every day until finished. 11/28/13   Hope Orlene OchM Neese, NP  Citalopram Hydrobromide (CELEXA PO) Take by mouth.    Historical  Provider, MD  clonazePAM (KLONOPIN) 1 MG tablet Take 1 mg by mouth 2 (two) times daily as needed.    Historical Provider, MD  cyclobenzaprine (FLEXERIL) 10 MG tablet Take 1 tablet (10 mg total) by mouth 3 (three) times daily as needed for muscle spasms. 08/04/13   Charles B. Bernette MayersSheldon, MD  guaiFENesin-codeine [facility](ROBITUSSIN AC) 100-10 MG/5ML syrup Take 5 mLs by mouth 3 (three) times daily as needed for cough. 11/28/13   Hope Orlene OchM Neese, NP  HYDROcodone-acetaminophen (NORCO/VICODIN) 5-325 MG per tablet Take 1 tablet by mouth every 4 (four) hours as needed for pain. 04/28/13   Teressa LowerVrinda Pickering, NP  Topiramate (TOPAMAX PO) Take by mouth.    Historical Provider, MD  traMADol (ULTRAM) 50 MG tablet Take 50 mg by mouth every 6 (six) hours as needed.    Historical Provider, MD  zolpidem (AMBIEN) 5 MG tablet Take 1 tablet (5 mg total) by mouth at bedtime as needed for sleep. 02/11/13   Glynn OctaveStephen Rancour, MD   BP 110/73  Pulse 106  Temp(Src) 98.7 F (37.1 C) (Oral)  Resp 16  Ht 5\' 4"  (1.626 m)  Wt 115 lb (52.164 kg)  BMI 19.73 kg/m2  SpO2 100%  LMP 03/21/2014 Physical Exam  Nursing note and vitals reviewed. Constitutional: She is oriented to person, place, and time. She appears well-developed and well-nourished.  HENT:  Head: Normocephalic.  Right Ear: External ear normal.  Left Ear: External  ear normal.  Nose: Nose normal.  Mouth/Throat: Oropharynx is clear and moist.  Eyes: Conjunctivae and EOM are normal. Pupils are equal, round, and reactive to light.  Neck: Normal range of motion.  Cardiovascular: Normal rate and normal heart sounds.   Pulmonary/Chest: Effort normal and breath sounds normal.  Abdominal: Soft. She exhibits no distension.  Musculoskeletal: Normal range of motion.  Neurological: She is alert and oriented to person, place, and time.  Skin: Skin is warm and dry.  Psychiatric: She has a normal mood and affect.    ED Course  Procedures (including critical care time) Labs Review Labs  Reviewed - No data to display  Imaging Review No results found.   EKG Interpretation None      MDM   Final diagnoses:  Pharyngitis  Vomiting  Bronchitis    Chest xray negative, strep negative.   Pt given zofran and po fluids. Rx for zithromax Rx for zofran    Elson AreasLeslie K Caitlynn Ju, Cordelia Poche-C 03/28/14 1127

## 2014-03-28 NOTE — Discharge Instructions (Signed)
Bronchitis Bronchitis is inflammation of the airways that extend from the windpipe into the lungs (bronchi). The inflammation often causes mucus to develop, which leads to a cough. If the inflammation becomes severe, it may cause shortness of breath. CAUSES  Bronchitis may be caused by:   Viral infections.   Bacteria.   Cigarette smoke.   Allergens, pollutants, and other irritants.  SIGNS AND SYMPTOMS  The most common symptom of bronchitis is a frequent cough that produces mucus. Other symptoms include:  Fever.   Body aches.   Chest congestion.   Chills.   Shortness of breath.   Sore throat.  DIAGNOSIS  Bronchitis is usually diagnosed through a medical history and physical exam. Tests, such as chest X-rays, are sometimes done to rule out other conditions.  TREATMENT  You may need to avoid contact with whatever caused the problem (smoking, for example). Medicines are sometimes needed. These may include:  Antibiotics. These may be prescribed if the condition is caused by bacteria.  Cough suppressants. These may be prescribed for relief of cough symptoms.   Inhaled medicines. These may be prescribed to help open your airways and make it easier for you to breathe.   Steroid medicines. These may be prescribed for those with recurrent (chronic) bronchitis. HOME CARE INSTRUCTIONS  Get plenty of rest.   Drink enough fluids to keep your urine clear or pale yellow (unless you have a medical condition that requires fluid restriction). Increasing fluids may help thin your secretions and will prevent dehydration.   Only take over-the-counter or prescription medicines as directed by your health care provider.  Only take antibiotics as directed. Make sure you finish them even if you start to feel better.  Avoid secondhand smoke, irritating chemicals, and strong fumes. These will make bronchitis worse. If you are a smoker, quit smoking. Consider using nicotine gum or  skin patches to help control withdrawal symptoms. Quitting smoking will help your lungs heal faster.   Put a cool-mist humidifier in your bedroom at night to moisten the air. This may help loosen mucus. Change the water in the humidifier daily. You can also run the hot water in your shower and sit in the bathroom with the door closed for 5 10 minutes.   Follow up with your health care provider as directed.   Wash your hands frequently to avoid catching bronchitis again or spreading an infection to others.  SEEK MEDICAL CARE IF: Your symptoms do not improve after 1 week of treatment.  SEEK IMMEDIATE MEDICAL CARE IF:  Your fever increases.  You have chills.   You have chest pain.   You have worsening shortness of breath.   You have bloody sputum.  You faint.  You have lightheadedness.  You have a severe headache.   You vomit repeatedly. MAKE SURE YOU:   Understand these instructions.  Will watch your condition.  Will get help right away if you are not doing well or get worse. Document Released: 11/18/2005 Document Revised: 09/08/2013 Document Reviewed: 07/13/2013 [facility] Patient Information 2014 [place], [place]. Sore Throat A sore throat is pain, burning, irritation, or scratchiness of the throat. There is often pain or tenderness when swallowing or talking. A sore throat may be accompanied by other symptoms, such as coughing, sneezing, fever, and swollen neck glands. A sore throat is often the first sign of another sickness, such as a cold, flu, strep throat, or mononucleosis (commonly known as mono). Most sore throats go away without medical treatment. CAUSES  The  most common causes of a sore throat include:  A viral infection, such as a cold, flu, or mono.  A bacterial infection, such as strep throat, tonsillitis, or whooping cough.  Seasonal allergies.  Dryness in the air.  Irritants, such as smoke or pollution.  Gastroesophageal reflux disease  (GERD). HOME CARE INSTRUCTIONS   Only take over-the-counter medicines as directed by your caregiver.  Drink enough fluids to keep your urine clear or pale yellow.  Rest as needed.  Try using throat sprays, lozenges, or sucking on hard candy to ease any pain (if older than 4 years or as directed).  Sip warm liquids, such as broth, herbal tea, or warm water with honey to relieve pain temporarily. You may also eat or drink cold or frozen liquids such as frozen ice pops.  Gargle with salt water (mix 1 tsp salt with 8 oz of water).  Do not smoke and avoid secondhand smoke.  Put a cool-mist humidifier in your bedroom at night to moisten the air. You can also turn on a hot shower and sit in the bathroom with the door closed for 5 10 minutes. SEEK IMMEDIATE MEDICAL CARE IF:  You have difficulty breathing.  You are unable to swallow fluids, soft foods, or your saliva.  You have increased swelling in the throat.  Your sore throat does not get better in 7 days.  You have nausea and vomiting.  You have a fever or persistent symptoms for more than 2 3 days.  You have a fever and your symptoms suddenly get worse. MAKE SURE YOU:   Understand these instructions.  Will watch your condition.  Will get help right away if you are not doing well or get worse. Document Released: 12/26/2004 Document Revised: 11/04/2012 Document Reviewed: 07/26/2012 [facility] Patient Information 2014 [place], [place].

## 2014-03-30 LAB — CULTURE, GROUP A STREP

## 2014-04-01 ENCOUNTER — Telehealth (HOSPITAL_BASED_OUTPATIENT_CLINIC_OR_DEPARTMENT_OTHER): Payer: Self-pay | Admitting: Emergency Medicine

## 2014-04-01 NOTE — Telephone Encounter (Signed)
Post ED Visit - Positive Culture Follow-up  Culture report reviewed by antimicrobial stewardship pharmacist: []  Nicole Mccoy, Pharm.D., BCPS []  Nicole Mccoy, Pharm.D., BCPS [x]  Nicole Mccoy, [address]Ph[address] BCPS []  Nicole Mccoy, [address]Ph[address] BCPS, AAHIVP []  Nicole Mccoy, Pharm.D., BCPS, AAHIVP []  Nicole Mccoy, Pharm.D.  Positive strep culture Treated with Azithromycin, organism sensitive to the same and no further patient follow-up is required at this time.  Nicole Mccoy 04/01/2014, 2:24 PM

## 2014-04-27 ENCOUNTER — Encounter (HOSPITAL_BASED_OUTPATIENT_CLINIC_OR_DEPARTMENT_OTHER): Payer: Self-pay | Admitting: Emergency Medicine

## 2014-04-27 ENCOUNTER — Emergency Department (HOSPITAL_BASED_OUTPATIENT_CLINIC_OR_DEPARTMENT_OTHER)
Admission: EM | Admit: 2014-04-27 | Discharge: 2014-04-27 | Disposition: A | Discharge status: Home / Self Care | Payer: Medicaid Other | Attending: Emergency Medicine | Admitting: Emergency Medicine

## 2014-04-27 ENCOUNTER — Emergency Department (HOSPITAL_BASED_OUTPATIENT_CLINIC_OR_DEPARTMENT_OTHER): Payer: Medicaid Other

## 2014-04-27 DIAGNOSIS — Z79899 Other long term (current) drug therapy: Secondary | ICD-10-CM | POA: Insufficient documentation

## 2014-04-27 DIAGNOSIS — Z8679 Personal history of other diseases of the circulatory system: Secondary | ICD-10-CM | POA: Insufficient documentation

## 2014-04-27 DIAGNOSIS — G43909 Migraine, unspecified, not intractable, without status migrainosus: Secondary | ICD-10-CM | POA: Insufficient documentation

## 2014-04-27 DIAGNOSIS — IMO0001 Reserved for inherently not codable concepts without codable children: Secondary | ICD-10-CM | POA: Insufficient documentation

## 2014-04-27 DIAGNOSIS — F17200 Nicotine dependence, unspecified, uncomplicated: Secondary | ICD-10-CM | POA: Insufficient documentation

## 2014-04-27 DIAGNOSIS — J029 Acute pharyngitis, unspecified: Secondary | ICD-10-CM | POA: Insufficient documentation

## 2014-04-27 DIAGNOSIS — M545 Low back pain, unspecified: Secondary | ICD-10-CM | POA: Insufficient documentation

## 2014-04-27 DIAGNOSIS — Z8701 Personal history of pneumonia (recurrent): Secondary | ICD-10-CM | POA: Insufficient documentation

## 2014-04-27 DIAGNOSIS — Z3202 Encounter for pregnancy test, result negative: Secondary | ICD-10-CM | POA: Insufficient documentation

## 2014-04-27 HISTORY — DX: Pneumonia, unspecified organism: J18.9

## 2014-04-27 LAB — URINALYSIS, ROUTINE W REFLEX MICROSCOPIC
Bilirubin Urine: NEGATIVE
GLUCOSE, UA: NEGATIVE mg/dL
HGB URINE DIPSTICK: NEGATIVE
KETONES UR: NEGATIVE mg/dL
Nitrite: NEGATIVE
PROTEIN: NEGATIVE mg/dL
Specific Gravity, Urine: 1.016 (ref 1.005–1.030)
Urobilinogen, UA: 0.2 mg/dL (ref 0.0–1.0)
pH: 7 (ref 5.0–8.0)

## 2014-04-27 LAB — RAPID STREP SCREEN (MED CTR MEBANE ONLY): STREPTOCOCCUS, GROUP A SCREEN (DIRECT): NEGATIVE

## 2014-04-27 LAB — PREGNANCY, URINE: Preg Test, Ur: NEGATIVE

## 2014-04-27 LAB — BASIC METABOLIC PANEL
BUN: 12 mg/dL (ref 6–23)
CALCIUM: 8.8 mg/dL (ref 8.4–10.5)
CO2: 21 meq/L (ref 19–32)
Chloride: 108 mEq/L (ref 96–112)
Creatinine, Ser: 0.7 mg/dL (ref 0.50–1.10)
GFR calc Af Amer: >90 mL/min (ref >90)
GLUCOSE: 91 mg/dL (ref 70–99)
Potassium: 4.1 mEq/L (ref 3.7–5.3)
Sodium: 142 mEq/L (ref 137–147)

## 2014-04-27 LAB — URINE MICROSCOPIC-ADD ON

## 2014-04-27 LAB — CBC
HCT: 40.4 % (ref 36.0–46.0)
HEMOGLOBIN: 13.5 g/dL (ref 12.0–15.0)
MCH: 33.9 pg (ref 26.0–34.0)
MCHC: 33.4 g/dL (ref 30.0–36.0)
MCV: 101.5 fL — AB (ref 78.0–100.0)
PLATELETS: 203 10*3/uL (ref 150–400)
RBC: 3.98 MIL/uL (ref 3.87–5.11)
RDW: 12.7 % (ref 11.5–15.5)
WBC: 10.6 10*3/uL — ABNORMAL HIGH (ref 4.0–10.5)

## 2014-04-27 LAB — MONONUCLEOSIS SCREEN: Mono Screen: NEGATIVE

## 2014-04-27 MED ORDER — METRONIDAZOLE 0.75 % VA GEL: 1.0000 | Freq: Two times a day (BID) | VAGINAL | Status: DC

## 2014-04-27 NOTE — ED Provider Notes (Signed)
Medical screening examination/treatment/procedure(s) were performed by non-physician practitioner and as supervising physician I was immediately available for consultation/collaboration.   EKG Interpretation None        Gwyneth Sprout, MD 04/27/14 2332

## 2014-04-27 NOTE — ED Provider Notes (Signed)
CSN: [id number]     Arrival date & time 04/27/14  1750 History   First MD Initiated Contact with Patient 04/27/14 1909     Chief Complaint  Patient presents with  . Sore Throat     (Consider location/radiation/quality/duration/timing/severity/associated sxs/prior Treatment) HPI Comments: Pt states that she started having a sore throat 2 weeks ago. Subjective fever. Pt states that she has had minimal coughing. Pt states that she is having some lower back pain. No dysuria. States that she feels like she did when she had pneumonia. Generalized body aches and extreme exhaustion  The history is provided by the patient. No language interpreter was used.    Past Medical History  Diagnosis Date  . Migraine   . Fibromyalgia   . Raynaud's syndrome   . Pneumonia    Past Surgical History  Procedure Laterality Date  . Shoulder surgery     No family history on file. History  Substance Use Topics  . Smoking status: Current Every Day Smoker -- 0.50 packs/day    Types: Cigarettes  . Smokeless tobacco: Never Used  . Alcohol Use: Yes     Comment: rare   OB History   Grav Para Term Preterm Abortions TAB SAB Ect Mult Living                 Review of Systems  Constitutional: Positive for fever.  Respiratory: Negative.   Cardiovascular: Negative.       Allergies  Lorazepam and Sulfa antibiotics  Home Medications   Prior to Admission medications   Medication Sig Start Date End Date Taking? Authorizing Provider  azithromycin (ZITHROMAX) 250 MG tablet Take 1 tablet (250 mg total) by mouth daily. Take first 2 tablets together, then 1 every day until finished. 11/28/13   Hope Orlene Och, NP  azithromycin (ZITHROMAX) 250 MG tablet Take 1 tablet (250 mg total) by mouth daily. Take first 2 tablets together, then 1 every day until finished. 03/28/14   Elson Areas, PA-C  Citalopram Hydrobromide (CELEXA PO) Take by mouth.    Historical Provider, MD  clonazePAM (KLONOPIN) 1 MG tablet Take 1 mg  by mouth 2 (two) times daily as needed.    Historical Provider, MD  cyclobenzaprine (FLEXERIL) 10 MG tablet Take 1 tablet (10 mg total) by mouth 3 (three) times daily as needed for muscle spasms. 08/04/13   Charles B. Bernette Mayers, MD  guaiFENesin-codeine [facility]) 100-10 MG/5ML syrup Take 5 mLs by mouth 3 (three) times daily as needed for cough. 11/28/13   Hope Orlene Och, NP  HYDROcodone-acetaminophen (NORCO/VICODIN) 5-325 MG per tablet Take 1 tablet by mouth every 4 (four) hours as needed for pain. 04/28/13   Teressa Lower, NP  ondansetron (ZOFRAN ODT) 4 MG disintegrating tablet Take 1 tablet (4 mg total) by mouth every 8 (eight) hours as needed for nausea or vomiting. 03/28/14   Elson Areas, PA-C  Topiramate (TOPAMAX PO) Take by mouth.    Historical Provider, MD  traMADol (ULTRAM) 50 MG tablet Take 50 mg by mouth every 6 (six) hours as needed.    Historical Provider, MD  zolpidem (AMBIEN) 5 MG tablet Take 1 tablet (5 mg total) by mouth at bedtime as needed for sleep. 02/11/13   Glynn Octave, MD   BP 107/80  Pulse 114  Temp(Src) 99 F (37.2 C) (Oral)  Resp 18  Ht 5\' 4"  (1.626 m)  Wt 110 lb (49.896 kg)  BMI 18.87 kg/m2  SpO2 100%  LMP 03/19/2014 Physical Exam  Nursing note and vitals reviewed. Constitutional: She is oriented to person, place, and time. She appears well-developed and well-nourished.  HENT:  Right Ear: External ear normal.  Left Ear: External ear normal.  Mouth/Throat: Posterior oropharyngeal erythema present.  Eyes: Conjunctivae and EOM are normal.  Neck: Normal range of motion. Neck supple.  Cardiovascular: Normal rate and regular rhythm.   Pulmonary/Chest: Effort normal and breath sounds normal.  Musculoskeletal: Normal range of motion.  Neurological: She is alert and oriented to person, place, and time.  Skin: Skin is warm and dry.  Psychiatric: She has a normal mood and affect.    ED Course  Procedures (including critical care time) Labs Review Labs  Reviewed  CBC - Abnormal; Notable for the following:    WBC 10.6 (*)    MCV 101.5 (*)    All other components within normal limits  URINALYSIS, ROUTINE W REFLEX MICROSCOPIC - Abnormal; Notable for the following:    APPearance CLOUDY (*)    Leukocytes, UA TRACE (*)    All other components within normal limits  RAPID STREP SCREEN  CULTURE, GROUP A STREP  MONONUCLEOSIS SCREEN  BASIC METABOLIC PANEL  PREGNANCY, URINE  URINE MICROSCOPIC-ADD ON    Imaging Review Dg Chest 2 View  04/27/2014   CLINICAL DATA:  Sore throat with intermittent low grade fever for 2 weeks.  EXAM: CHEST  2 VIEW  COMPARISON:  03/28/2014 and 11/28/2013.  FINDINGS: The heart size and mediastinal contours are normal. The lungs are clear. There is no pleural effusion or pneumothorax. No acute osseous findings are identified.  IMPRESSION: Stable chest.  No active cardiopulmonary process.   Electronically Signed   By: Roxy HorsemanBill  Veazey M.D.   On: 04/27/2014 18:51     EKG Interpretation None      MDM   Final diagnoses:  Pharyngitis    No acute abnormalities noted. Pt requesting metrogel for bv at this time. Pt non septic in appearance    Teressa LowerVrinda Unnamed Hino, NP 04/27/14 2048

## 2014-04-27 NOTE — ED Notes (Signed)
C/o slight cough, sorethroat and being tired x 2 weeks

## 2014-04-27 NOTE — Discharge Instructions (Signed)
Pharyngitis °Pharyngitis is redness, pain, and swelling (inflammation) of your pharynx.  °CAUSES  °Pharyngitis is usually caused by infection. Most of the time, these infections are from viruses (viral) and are part of a cold. However, sometimes pharyngitis is caused by bacteria (bacterial). Pharyngitis can also be caused by allergies. Viral pharyngitis may be spread from person to person by coughing, sneezing, and personal items or utensils (cups, forks, spoons, toothbrushes). Bacterial pharyngitis may be spread from person to person by more intimate contact, such as kissing.  °SIGNS AND SYMPTOMS  °Symptoms of pharyngitis include:   °· Sore throat.   °· Tiredness (fatigue).   °· Low-grade fever.   °· Headache. °· Joint pain and muscle aches. °· Skin rashes. °· Swollen lymph nodes. °· Plaque-like film on throat or tonsils (often seen with bacterial pharyngitis). °DIAGNOSIS  °Your health care provider will ask you questions about your illness and your symptoms. Your medical history, along with a physical exam, is often all that is needed to diagnose pharyngitis. Sometimes, a rapid strep test is done. Other lab tests may also be done, depending on the suspected cause.  °TREATMENT  °Viral pharyngitis will usually get better in 3 4 days without the use of medicine. Bacterial pharyngitis is treated with medicines that kill germs (antibiotics).  °HOME CARE INSTRUCTIONS  °· Drink enough water and fluids to keep your urine clear or pale yellow.   °· Only take over-the-counter or prescription medicines as directed by your health care provider:   °· If you are prescribed antibiotics, make sure you finish them even if you start to feel better.   °· Do not take aspirin.   °· Get lots of rest.   °· Gargle with 8 oz of salt water (½ tsp of salt per 1 qt of water) as often as every 1 2 hours to soothe your throat.   °· Throat lozenges (if you are not at risk for choking) or sprays may be used to soothe your throat. °SEEK MEDICAL  CARE IF:  °· You have large, tender lumps in your neck. °· You have a rash. °· You cough up green, yellow-brown, or bloody spit. °SEEK IMMEDIATE MEDICAL CARE IF:  °· Your neck becomes stiff. °· You drool or are unable to swallow liquids. °· You vomit or are unable to keep medicines or liquids down. °· You have severe pain that does not go away with the use of recommended medicines. °· You have trouble breathing (not caused by a stuffy nose). °MAKE SURE YOU:  °· Understand these instructions. °· Will watch your condition. °· Will get help right away if you are not doing well or get worse. °Document Released: 11/18/2005 Document Revised: 09/08/2013 Document Reviewed: 07/26/2013 °ExitCare® Patient Information ©2014 [employer]. ° °

## 2014-04-27 NOTE — ED Notes (Signed)
Sore throat and fatigue x2 weeks.  Sts "I feel like I did when I had pneumonia."  Minimal coughing.

## 2014-04-29 LAB — CULTURE, GROUP A STREP

## 2014-08-20 ENCOUNTER — Encounter (HOSPITAL_BASED_OUTPATIENT_CLINIC_OR_DEPARTMENT_OTHER): Payer: Self-pay | Admitting: Emergency Medicine

## 2014-08-20 ENCOUNTER — Emergency Department (HOSPITAL_BASED_OUTPATIENT_CLINIC_OR_DEPARTMENT_OTHER)
Admission: EM | Admit: 2014-08-20 | Discharge: 2014-08-20 | Disposition: A | Discharge status: Home / Self Care | Payer: Medicaid Other | Attending: Emergency Medicine | Admitting: Emergency Medicine

## 2014-08-20 DIAGNOSIS — Z8701 Personal history of pneumonia (recurrent): Secondary | ICD-10-CM | POA: Insufficient documentation

## 2014-08-20 DIAGNOSIS — Z8739 Personal history of other diseases of the musculoskeletal system and connective tissue: Secondary | ICD-10-CM | POA: Insufficient documentation

## 2014-08-20 DIAGNOSIS — G43109 Migraine with aura, not intractable, without status migrainosus: Secondary | ICD-10-CM | POA: Insufficient documentation

## 2014-08-20 DIAGNOSIS — G43909 Migraine, unspecified, not intractable, without status migrainosus: Secondary | ICD-10-CM | POA: Insufficient documentation

## 2014-08-20 DIAGNOSIS — F17200 Nicotine dependence, unspecified, uncomplicated: Secondary | ICD-10-CM | POA: Insufficient documentation

## 2014-08-20 DIAGNOSIS — Z8679 Personal history of other diseases of the circulatory system: Secondary | ICD-10-CM | POA: Insufficient documentation

## 2014-08-20 DIAGNOSIS — Z79899 Other long term (current) drug therapy: Secondary | ICD-10-CM | POA: Insufficient documentation

## 2014-08-20 DIAGNOSIS — G43009 Migraine without aura, not intractable, without status migrainosus: Secondary | ICD-10-CM

## 2014-08-20 MED ORDER — HYDROMORPHONE HCL 1 MG/ML IJ SOLN
1.0000 mg | Freq: Once | INTRAMUSCULAR | Status: AC
Start: 2014-08-20 — End: 2014-08-20
  Administered 2014-08-20: 1 mg via INTRAVENOUS
  Filled 2014-08-20: qty 1

## 2014-08-20 MED ORDER — METOCLOPRAMIDE HCL 5 MG/ML IJ SOLN
10.0000 mg | Freq: Once | INTRAMUSCULAR | Status: AC
  Administered 2014-08-20: 10 mg via INTRAVENOUS
  Filled 2014-08-20: qty 2

## 2014-08-20 MED ORDER — DIPHENHYDRAMINE HCL 50 MG/ML IJ SOLN
12.5000 mg | Freq: Once | INTRAMUSCULAR | Status: AC
  Administered 2014-08-20: 12.5 mg via INTRAVENOUS
  Filled 2014-08-20: qty 1

## 2014-08-20 MED ORDER — DEXAMETHASONE SODIUM PHOSPHATE 10 MG/ML IJ SOLN: 10.0000 mg | Freq: Once | INTRAMUSCULAR | Status: DC

## 2014-08-20 MED ORDER — VALPROATE SODIUM 500 MG/5ML IV SOLN: 500.0000 mg | Freq: Once | INTRAVENOUS | Status: DC

## 2014-08-20 MED ORDER — PROMETHAZINE HCL 25 MG PO TABS: 25.0000 mg | ORAL_TABLET | Freq: Four times a day (QID) | ORAL | Status: DC | PRN

## 2014-08-20 MED ORDER — KETOROLAC TROMETHAMINE 30 MG/ML IJ SOLN
30.0000 mg | Freq: Once | INTRAMUSCULAR | Status: AC
  Administered 2014-08-20: 30 mg via INTRAVENOUS
  Filled 2014-08-20: qty 1

## 2014-08-20 MED ORDER — SODIUM CHLORIDE 0.9 % IV BOLUS (SEPSIS)
1000.0000 mL | Freq: Once | INTRAVENOUS | Status: AC
  Administered 2014-08-20: 1000 mL via INTRAVENOUS

## 2014-08-20 NOTE — ED Provider Notes (Signed)
CSN: [id number]     Arrival date & time 08/20/14  1053 History   First MD Initiated Contact with Patient 08/20/14 1200     Chief Complaint  Patient presents with  . Migraine     (Consider location/radiation/quality/duration/timing/severity/associated sxs/prior Treatment) Patient is a 33 y.o. female presenting with migraines. The history is provided by the patient. No language interpreter was used.  Migraine This is a new problem. The current episode started today. The problem occurs constantly. Associated symptoms include headaches, nausea and vomiting. Pertinent negatives include no abdominal pain, chest pain, chills, congestion, coughing, fever, myalgias, rash or weakness. Associated symptoms comments: Complains of migraine headache with a history of same that started at 3:00 a.m. this morning. Positive nausea, vomiting, photophobia. Maxalt and Tylenol without relief. No injury, fever, hematemesis, syncope..    Past Medical History  Diagnosis Date  . Migraine   . Fibromyalgia   . Raynaud's syndrome   . Pneumonia    Past Surgical History  Procedure Laterality Date  . Shoulder surgery     No family history on file. History  Substance Use Topics  . Smoking status: Current Every Day Smoker -- 0.50 packs/day    Types: Cigarettes  . Smokeless tobacco: Never Used  . Alcohol Use: Yes     Comment: rare   OB History   Grav Para Term Preterm Abortions TAB SAB Ect Mult Living                 Review of Systems  Constitutional: Negative for fever and chills.  HENT: Negative for congestion and sinus pressure.   Eyes: Positive for photophobia. Negative for visual disturbance.  Respiratory: Negative.  Negative for cough and shortness of breath.   Cardiovascular: Negative.  Negative for chest pain.  Gastrointestinal: Positive for nausea and vomiting. Negative for abdominal pain.  Musculoskeletal: Negative.  Negative for myalgias.  Skin: Negative.  Negative for rash.  Neurological:  Positive for headaches. Negative for dizziness, syncope and weakness.      Allergies  Lorazepam and Sulfa antibiotics  Home Medications   Prior to Admission medications   Medication Sig Start Date End Date Taking? Authorizing Provider  Citalopram Hydrobromide (CELEXA PO) Take by mouth.    Historical Provider, MD  clonazePAM (KLONOPIN) 1 MG tablet Take 1 mg by mouth 2 (two) times daily as needed.    Historical Provider, MD  cyclobenzaprine (FLEXERIL) 10 MG tablet Take 1 tablet (10 mg total) by mouth 3 (three) times daily as needed for muscle spasms. 08/04/13   Charles B. Bernette Mayers, MD  HYDROcodone-acetaminophen (NORCO/VICODIN) 5-325 MG per tablet Take 1 tablet by mouth every 4 (four) hours as needed for pain. 04/28/13   Teressa Lower, NP  metroNIDAZOLE (METROGEL VAGINAL) 0.75 % vaginal gel Place 1 Applicatorful vaginally 2 (two) times daily. 04/27/14   Teressa Lower, NP  ondansetron (ZOFRAN ODT) 4 MG disintegrating tablet Take 1 tablet (4 mg total) by mouth every 8 (eight) hours as needed for nausea or vomiting. 03/28/14   Elson Areas, PA-C  Topiramate (TOPAMAX PO) Take by mouth.    Historical Provider, MD  traMADol (ULTRAM) 50 MG tablet Take 50 mg by mouth every 6 (six) hours as needed.    Historical Provider, MD  zolpidem (AMBIEN) 5 MG tablet Take 1 tablet (5 mg total) by mouth at bedtime as needed for sleep. 02/11/13   Glynn Octave, MD   BP 102/72  Pulse 117  Temp(Src) 98.5 F (36.9 C) (Oral)  Resp 20  Ht  (1.626 m)  Wt 115 lb (52.164 kg)  BMI 19.73 kg/m2  SpO2 100%  LMP 08/08/2014 Physical Exam  Constitutional: She is oriented to person, place, and time. She appears well-developed and well-nourished. No distress.  HENT:  Head: Normocephalic.  Eyes: Pupils are equal, round, and reactive to light.  Neck: Normal range of motion. Neck supple.  Cardiovascular: Normal rate and regular rhythm.   Pulmonary/Chest: Effort normal and breath sounds normal.  Abdominal: Soft.  Bowel sounds are normal. There is no tenderness. There is no rebound and no guarding.  Musculoskeletal: Normal range of motion.  Neurological: She is alert and oriented to person, place, and time. She has normal strength and normal reflexes. No sensory deficit. She displays a negative Romberg sign. Coordination normal.  Speech clear and focused, no deficits of coordination, CN's 3-12 grossly intact.   Skin: Skin is warm and dry. No rash noted.  Psychiatric: She has a normal mood and affect.    ED Course  Procedures (including critical care time) Labs Review Labs Reviewed - No data to display  Imaging Review No results found.   EKG Interpretation None      MDM   Final diagnoses:  None    1. migraine  Headache some better with medications but still intense after Toradol. There is no Decadron or Valproic Acid available at this time. IV Dilaudid given after discussion regarding rebound risk with narcotic pain medications. She is agreeable to plan. Will discharge home after administration.  Arnoldo Hooker, PA-C 08/20/14 1358

## 2014-08-20 NOTE — ED Provider Notes (Signed)
Medical screening examination/treatment/procedure(s) were performed by non-physician practitioner and as supervising physician I was immediately available for consultation/collaboration.  Audree Camel, MD 08/20/14 [phone]

## 2014-08-20 NOTE — ED Notes (Signed)
Shari PA aware Valproate not available.

## 2014-08-20 NOTE — Discharge Instructions (Signed)

## 2014-08-20 NOTE — ED Notes (Signed)
Patient reports that she has had cold and congestion all week and this am awoke at 0300 with migraine and vomiting. Unable to keep any fluids down. Denies injury, alert and oriented

## 2014-08-20 NOTE — ED Notes (Signed)
Pt c/o IV hurting. No sx of infiltration, swelling or redness. Will put warm compress to arm. Leave IV in for in case of further IV meds. Pt agreeable.

## 2015-03-26 ENCOUNTER — Emergency Department (HOSPITAL_BASED_OUTPATIENT_CLINIC_OR_DEPARTMENT_OTHER)
Admission: EM | Admit: 2015-03-26 | Discharge: 2015-03-26 | Disposition: A | Discharge status: Home / Self Care | Payer: Self-pay | Attending: Emergency Medicine | Admitting: Emergency Medicine

## 2015-03-26 ENCOUNTER — Encounter (HOSPITAL_BASED_OUTPATIENT_CLINIC_OR_DEPARTMENT_OTHER): Payer: Self-pay | Admitting: *Deleted

## 2015-03-26 ENCOUNTER — Emergency Department (HOSPITAL_BASED_OUTPATIENT_CLINIC_OR_DEPARTMENT_OTHER): Payer: Medicaid Other

## 2015-03-26 DIAGNOSIS — Z720 Tobacco use: Secondary | ICD-10-CM | POA: Insufficient documentation

## 2015-03-26 DIAGNOSIS — Z8701 Personal history of pneumonia (recurrent): Secondary | ICD-10-CM | POA: Insufficient documentation

## 2015-03-26 DIAGNOSIS — Z3202 Encounter for pregnancy test, result negative: Secondary | ICD-10-CM | POA: Insufficient documentation

## 2015-03-26 DIAGNOSIS — Z792 Long term (current) use of antibiotics: Secondary | ICD-10-CM | POA: Insufficient documentation

## 2015-03-26 DIAGNOSIS — M797 Fibromyalgia: Secondary | ICD-10-CM | POA: Insufficient documentation

## 2015-03-26 DIAGNOSIS — Z79899 Other long term (current) drug therapy: Secondary | ICD-10-CM | POA: Insufficient documentation

## 2015-03-26 DIAGNOSIS — G43109 Migraine with aura, not intractable, without status migrainosus: Secondary | ICD-10-CM | POA: Insufficient documentation

## 2015-03-26 DIAGNOSIS — R1013 Epigastric pain: Secondary | ICD-10-CM | POA: Insufficient documentation

## 2015-03-26 LAB — CBC WITH DIFFERENTIAL/PLATELET
Basophils Absolute: 0 10*3/uL (ref 0.0–0.1)
Basophils Relative: 0 % (ref 0–1)
EOS PCT: 1 % (ref 0–5)
Eosinophils Absolute: 0.1 10*3/uL (ref 0.0–0.7)
HCT: 41.4 % (ref 36.0–46.0)
Hemoglobin: 13.7 g/dL (ref 12.0–15.0)
LYMPHS PCT: 19 % (ref 12–46)
Lymphs Abs: 1.3 10*3/uL (ref 0.7–4.0)
MCH: 33.1 pg (ref 26.0–34.0)
MCHC: 33.1 g/dL (ref 30.0–36.0)
MCV: 100 fL (ref 78.0–100.0)
MONOS PCT: 11 % (ref 3–12)
Monocytes Absolute: 0.7 10*3/uL (ref 0.1–1.0)
NEUTROS PCT: 69 % (ref 43–77)
Neutro Abs: 4.9 10*3/uL (ref 1.7–7.7)
Platelets: 160 10*3/uL (ref 150–400)
RBC: 4.14 MIL/uL (ref 3.87–5.11)
RDW: 12.5 % (ref 11.5–15.5)
WBC: 7 10*3/uL (ref 4.0–10.5)

## 2015-03-26 LAB — URINALYSIS, ROUTINE W REFLEX MICROSCOPIC
BILIRUBIN URINE: NEGATIVE
Glucose, UA: NEGATIVE mg/dL
Hgb urine dipstick: NEGATIVE
KETONES UR: 15 mg/dL — AB
Leukocytes, UA: NEGATIVE
Nitrite: NEGATIVE
PH: 7.5 (ref 5.0–8.0)
Protein, ur: NEGATIVE mg/dL
SPECIFIC GRAVITY, URINE: 1.009 (ref 1.005–1.030)
Urobilinogen, UA: 0.2 mg/dL (ref 0.0–1.0)

## 2015-03-26 LAB — COMPREHENSIVE METABOLIC PANEL
ALBUMIN: 4.4 g/dL (ref 3.5–5.2)
ALT: 16 U/L (ref 0–35)
AST: 23 U/L (ref 0–37)
Alkaline Phosphatase: 67 U/L (ref 39–117)
Anion gap: 8 (ref 5–15)
BUN: 7 mg/dL (ref 6–23)
CO2: 22 mmol/L (ref 19–32)
CREATININE: 0.61 mg/dL (ref 0.50–1.10)
Calcium: 8.7 mg/dL (ref 8.4–10.5)
Chloride: 109 mmol/L (ref 96–112)
GFR calc Af Amer: >90 mL/min (ref >90)
Glucose, Bld: 103 mg/dL — ABNORMAL HIGH (ref 70–99)
Potassium: 3.5 mmol/L (ref 3.5–5.1)
Sodium: 139 mmol/L (ref 135–145)
Total Bilirubin: 0.4 mg/dL (ref 0.3–1.2)
Total Protein: 7 g/dL (ref 6.0–8.3)

## 2015-03-26 LAB — LIPASE, BLOOD: LIPASE: 27 U/L (ref 11–59)

## 2015-03-26 LAB — PREGNANCY, URINE: PREG TEST UR: NEGATIVE

## 2015-03-26 MED ORDER — SODIUM CHLORIDE 0.9 % IV BOLUS (SEPSIS)
1000.0000 mL | Freq: Once | INTRAVENOUS | Status: AC
  Administered 2015-03-26: 1000 mL via INTRAVENOUS

## 2015-03-26 MED ORDER — DIPHENHYDRAMINE HCL 50 MG/ML IJ SOLN
25.0000 mg | Freq: Once | INTRAMUSCULAR | Status: AC
  Administered 2015-03-26: 25 mg via INTRAVENOUS
  Filled 2015-03-26: qty 1

## 2015-03-26 MED ORDER — HYDROMORPHONE HCL 1 MG/ML IJ SOLN
1.0000 mg | Freq: Once | INTRAMUSCULAR | Status: AC
Start: 2015-03-26 — End: 2015-03-26
  Administered 2015-03-26: 1 mg via INTRAVENOUS
  Filled 2015-03-26: qty 1

## 2015-03-26 MED ORDER — HYDROCODONE-ACETAMINOPHEN 5-325 MG PO TABS
2.0000 | ORAL_TABLET | ORAL | Status: DC | PRN
Start: 2015-03-26 — End: 2015-10-02

## 2015-03-26 MED ORDER — RANITIDINE HCL 150 MG PO TABS: 150.0000 mg | ORAL_TABLET | Freq: Two times a day (BID) | ORAL | Status: DC

## 2015-03-26 MED ORDER — KETOROLAC TROMETHAMINE 30 MG/ML IJ SOLN
30.0000 mg | Freq: Once | INTRAMUSCULAR | Status: AC
  Administered 2015-03-26: 30 mg via INTRAVENOUS
  Filled 2015-03-26: qty 1

## 2015-03-26 MED ORDER — METOCLOPRAMIDE HCL 5 MG/ML IJ SOLN
10.0000 mg | Freq: Once | INTRAMUSCULAR | Status: AC
  Administered 2015-03-26: 10 mg via INTRAVENOUS
  Filled 2015-03-26: qty 2

## 2015-03-26 MED ORDER — PROMETHAZINE HCL 25 MG PO TABS: 25.0000 mg | ORAL_TABLET | Freq: Four times a day (QID) | ORAL | Status: DC | PRN

## 2015-03-26 MED ORDER — DEXAMETHASONE SODIUM PHOSPHATE 10 MG/ML IJ SOLN
10.0000 mg | Freq: Once | INTRAMUSCULAR | Status: AC
  Administered 2015-03-26: 10 mg via INTRAVENOUS
  Filled 2015-03-26: qty 1

## 2015-03-26 NOTE — ED Notes (Signed)
Patient notified of the medications she is getting to treat her headache.  Patient states "yeah I know the headache cocktail.  That doesn't work for me and they have to give me the dilaudid".  Told patient that we needed to try this as narcotics can cause rebound headaches.  Patient verbalized understanding.

## 2015-03-26 NOTE — Discharge Instructions (Signed)
Abdominal Pain °Many things can cause abdominal pain. Usually, abdominal pain is not caused by a disease and will improve without treatment. It can often be observed and treated at home. Your health care provider will do a physical exam and possibly order blood tests and X-rays to help determine the seriousness of your pain. However, in many cases, more time must pass before a clear cause of the pain can be found. Before that point, your health care provider may not know if you need more testing or further treatment. °HOME CARE INSTRUCTIONS  °Monitor your abdominal pain for any changes. The following actions may help to alleviate any discomfort you are experiencing: °· Only take over-the-counter or prescription medicines as directed by your health care provider. °· Do not take laxatives unless directed to do so by your health care provider. °· Try a clear liquid diet (broth, tea, or water) as directed by your health care provider. Slowly move to a bland diet as tolerated. °SEEK MEDICAL CARE IF: °· You have unexplained abdominal pain. °· You have abdominal pain associated with nausea or diarrhea. °· You have pain when you urinate or have a bowel movement. °· You experience abdominal pain that wakes you in the night. °· You have abdominal pain that is worsened or improved by eating food. °· You have abdominal pain that is worsened with eating fatty foods. °· You have a fever. °SEEK IMMEDIATE MEDICAL CARE IF:  °· Your pain does not go away within 2 hours. °· You keep throwing up (vomiting). °· Your pain is felt only in portions of the abdomen, such as the right side or the left lower portion of the abdomen. °· You pass bloody or black tarry stools. °MAKE SURE YOU: °· Understand these instructions.   °· Will watch your condition.   °· Will get help right away if you are not doing well or get worse.   °Document Released: 08/28/2005 Document Revised: 11/23/2013 Document Reviewed: 07/28/2013 °Exit[employer]® Patient Information  ©2015 [employer]. This information is not intended to replace advice given to you by your health care provider. Make sure you discuss any questions you have with your health care provider. ° °Migraine Headache °A migraine headache is an intense, throbbing pain on one or both sides of your head. A migraine can last for 30 minutes to several hours. °CAUSES  °The exact cause of a migraine headache is not always known. However, a migraine may be caused when nerves in the brain become irritated and release chemicals that cause inflammation. This causes pain. °Certain things may also trigger migraines, such as: °· Alcohol. °· Smoking. °· Stress. °· Menstruation. °· Aged cheeses. °· Foods or drinks that contain nitrates, glutamate, aspartame, or tyramine. °· Lack of sleep. °· Chocolate. °· Caffeine. °· Hunger. °· Physical exertion. °· Fatigue. °· Medicines used to treat chest pain (nitroglycerine), birth control pills, estrogen, and some blood pressure medicines. °SIGNS AND SYMPTOMS °· Pain on one or both sides of your head. °· Pulsating or throbbing pain. °· Severe pain that prevents daily activities. °· Pain that is aggravated by any physical activity. °· Nausea, vomiting, or both. °· Dizziness. °· Pain with exposure to bright lights, loud noises, or activity. °· General sensitivity to bright lights, loud noises, or smells. °Before you get a migraine, you may get warning signs that a migraine is coming (aura). An aura may include: °· Seeing flashing lights. °· Seeing bright spots, halos, or zigzag lines. °· Having tunnel vision or blurred vision. °· Having feelings   of numbness or tingling. °· Having trouble talking. °· Having muscle weakness. °DIAGNOSIS  °A migraine headache is often diagnosed based on: °· Symptoms. °· Physical exam. °· A CT scan or MRI of your head. These imaging tests cannot diagnose migraines, but they can help rule out other causes of headaches. °TREATMENT °Medicines may be given for pain and  nausea. Medicines can also be given to help prevent recurrent migraines.  °HOME CARE INSTRUCTIONS °· Only take over-the-counter or prescription medicines for pain or discomfort as directed by your health care provider. The use of long-term narcotics is not recommended. °· Lie down in a dark, quiet room when you have a migraine. °· Keep a journal to find out what may trigger your migraine headaches. For example, write down: °¨ What you eat and drink. °¨ How much sleep you get. °¨ Any change to your diet or medicines. °· Limit alcohol consumption. °· Quit smoking if you smoke. °· Get 7-9 hours of sleep, or as recommended by your health care provider. °· Limit stress. °· Keep lights dim if bright lights bother you and make your migraines worse. °SEEK IMMEDIATE MEDICAL CARE IF:  °· Your migraine becomes severe. °· You have a fever. °· You have a stiff neck. °· You have vision loss. °· You have muscular weakness or loss of muscle control. °· You start losing your balance or have trouble walking. °· You feel faint or pass out. °· You have severe symptoms that are different from your first symptoms. °MAKE SURE YOU:  °· Understand these instructions. °· Will watch your condition. °· Will get help right away if you are not doing well or get worse. °Document Released: 11/18/2005 Document Revised: 04/04/2014 Document Reviewed: 07/26/2013 °ExitCare® Patient Information ©2015 [employer]. This information is not intended to replace advice given to you by your health care provider. Make sure you discuss any questions you have with your health care provider. ° ° °

## 2015-03-26 NOTE — ED Notes (Signed)
Pt c/o migraine x 2 days.  ?

## 2015-03-26 NOTE — ED Notes (Signed)
Pt alert x4 respirations easy non labored skin warm dry  

## 2015-03-26 NOTE — ED Provider Notes (Signed)
CSN: [id number]     Arrival date & time 03/26/15  1124 History   First MD Initiated Contact with Patient 03/26/15 1149     Chief Complaint  Patient presents with  . Migraine     (Consider location/radiation/quality/duration/timing/severity/associated sxs/prior Treatment) HPI Comments: Patient presents to the emergency department for evaluation of multiple problems. Patient reports that she is experiencing a migraine headache. She is experiencing a typical migraine for her. She reports that she had a mild headache yesterday, but she awakened this morning at 4 AM with severe global throbbing headache. She has had her normal visual or and has now developed nausea and vomiting which is typical for her migraines. She has not had any weakness, numbness, tingling in extremities.  Patient does report that she started menstruating yesterday and had heavier bleeding and increased cramping than she normally has. No discharge.  You should also complaining of pain in the center of her upper abdomen. This is been ongoing for several days. Pain is sharp in the epigastric area and constant. It worsens when she swallows.  Patient is a 34 y.o. female presenting with migraines.  Migraine Associated symptoms include abdominal pain and headaches.    Past Medical History  Diagnosis Date  . Migraine   . Fibromyalgia   . Raynaud's syndrome   . Pneumonia    Past Surgical History  Procedure Laterality Date  . Shoulder surgery     History reviewed. No pertinent family history. History  Substance Use Topics  . Smoking status: Current Every Day Smoker -- 0.50 packs/day    Types: Cigarettes  . Smokeless tobacco: Never Used  . Alcohol Use: Yes     Comment: rare   OB History    No data available     Review of Systems  Gastrointestinal: Positive for nausea, vomiting and abdominal pain.  Genitourinary: Positive for vaginal bleeding.  Neurological: Positive for headaches.  All other systems reviewed  and are negative.     Allergies  Lorazepam and Sulfa antibiotics  Home Medications   Prior to Admission medications   Medication Sig Start Date End Date Taking? Authorizing Provider  Citalopram Hydrobromide (CELEXA PO) Take by mouth.    Historical Provider, MD  clonazePAM (KLONOPIN) 1 MG tablet Take 1 mg by mouth 2 (two) times daily as needed.    Historical Provider, MD  cyclobenzaprine (FLEXERIL) 10 MG tablet Take 1 tablet (10 mg total) by mouth 3 (three) times daily as needed for muscle spasms. 08/04/13   Susy Frizzle, MD  HYDROcodone-acetaminophen (NORCO/VICODIN) 5-325 MG per tablet Take 2 tablets by mouth every 4 (four) hours as needed for moderate pain. 03/26/15   Gilda Crease, MD  metroNIDAZOLE (METROGEL VAGINAL) 0.75 % vaginal gel Place 1 Applicatorful vaginally 2 (two) times daily. 04/27/14   Teressa Lower, NP  promethazine (PHENERGAN) 25 MG tablet Take 1 tablet (25 mg total) by mouth every 6 (six) hours as needed for nausea or vomiting. 03/26/15   Gilda Crease, MD  ranitidine (ZANTAC) 150 MG tablet Take 1 tablet (150 mg total) by mouth 2 (two) times daily. 03/26/15   Gilda Crease, MD  Topiramate (TOPAMAX PO) Take by mouth.    Historical Provider, MD  traMADol (ULTRAM) 50 MG tablet Take 50 mg by mouth every 6 (six) hours as needed.    Historical Provider, MD  zolpidem (AMBIEN) 5 MG tablet Take 1 tablet (5 mg total) by mouth at bedtime as needed for sleep. 02/11/13   Glynn Octave, MD  BP 118/75 mmHg  Pulse 103  Temp(Src) 98.4 F (36.9 C)  Resp 18  Ht 5\' 4"  (1.626 m)  Wt 115 lb (52.164 kg)  BMI 19.73 kg/m2  SpO2 99%  LMP 03/24/2015 Physical Exam  Constitutional: She is oriented to person, place, and time. She appears well-developed and well-nourished. No distress.  HENT:  Head: Normocephalic and atraumatic.  Right Ear: Hearing normal.  Left Ear: Hearing normal.  Nose: Nose normal.  Mouth/Throat: Oropharynx is clear and moist and mucous  membranes are normal.  Eyes: Conjunctivae and EOM are normal. Pupils are equal, round, and reactive to light.  Neck: Normal range of motion. Neck supple.  Cardiovascular: Regular rhythm, S1 normal and S2 normal.  Exam reveals no gallop and no friction rub.   No murmur heard. Pulmonary/Chest: Effort normal and breath sounds normal. No respiratory distress. She exhibits no tenderness.  Abdominal: Soft. Normal appearance and bowel sounds are normal. There is no hepatosplenomegaly. There is tenderness in the epigastric area. There is no rebound, no guarding, no tenderness at McBurney's point and negative Murphy's sign. No hernia.  Musculoskeletal: Normal range of motion.  Neurological: She is alert and oriented to person, place, and time. She has normal strength. No cranial nerve deficit or sensory deficit. Coordination normal. GCS eye subscore is 4. GCS verbal subscore is 5. GCS motor subscore is 6.  Skin: Skin is warm, dry and intact. No rash noted. No cyanosis.  Psychiatric: She has a normal mood and affect. Her speech is normal and behavior is normal. Thought content normal.  Nursing note and vitals reviewed.   ED Course  Procedures (including critical care time) Labs Review Labs Reviewed  COMPREHENSIVE METABOLIC PANEL - Abnormal; Notable for the following:    Glucose, Bld 103 (*)    All other components within normal limits  URINALYSIS, ROUTINE W REFLEX MICROSCOPIC - Abnormal; Notable for the following:    Ketones, ur 15 (*)    All other components within normal limits  CBC WITH DIFFERENTIAL/PLATELET  LIPASE, BLOOD  PREGNANCY, URINE    Imaging Review [place] Abdomen Complete  03/26/2015   CLINICAL DATA:  Epigastric abdominal pain, nausea and vomiting  EXAM: ULTRASOUND ABDOMEN COMPLETE  COMPARISON:  None.  FINDINGS: Gallbladder: No gallstones or wall thickening visualized. No sonographic Murphy sign noted.  Common bile duct: Diameter: 3 mm  Liver: Subcapsular mildly echogenic oval mass in  the left hepatic lobe measuring 1.0 x 0.7 x 0.7 cm. No intrahepatic ductal dilatation.  IVC: No abnormality visualized.  Pancreas: Visualized portion unremarkable.  Spleen: Size and appearance within normal limits.  Right Kidney: Length: 12 cm. Echogenicity within normal limits. No mass or hydronephrosis visualized.  Left Kidney: Length: 12.1 cm. Echogenicity within normal limits. No mass or hydronephrosis visualized.  Abdominal aorta: No aneurysm visualized.  Other findings: None.  IMPRESSION: No acute intra-abdominal abnormality.  Echogenic 1 cm left hepatic lobe lesion, statistically most likely hemangioma. Further conformation at outpatient nonemergent liver mass protocol MRI abdomen with contrast is recommended.   Electronically Signed   By: Christiana PellantGretchen  Green M.D.   On: 03/26/2015 14:43     EKG Interpretation   Date/Time:  Sunday March 26 2015 12:06:16 EDT Ventricular Rate:  101 PR Interval:  126 QRS Duration: 78 QT Interval:  364 QTC Calculation: 471 R Axis:   57 Text Interpretation:  Sinus tachycardia Otherwise normal ECG Confirmed by  POLLINA  MD, CHRISTOPHER [phone]([id number]) on 03/26/2015 12:26:02 PM      MDM   Final  diagnoses:  Migraine with aura and without status migrainosus, not intractable    Patient presents to the ER for evaluation of multiple problems. Patient's main complaint is migraine headache. She has a history of recurrent migraines. She reports the sensation of visual aura prior to onset of the headache consistent with previous migraines. She has a normal neurologic evaluation in the ER. She did not have any response to migraine cocktail. Looking through her records, she has received a single dose of narcotic analgesia for her headaches in the past. She was given a dose here in the ER and is to follow-up with her doctor as an outpatient.  Patient experiencing midepigastric discomfort. She has had nausea and vomiting associated with the migraine headache. Her workup for  gallbladder disease is entirely unremarkable. Treat with antacids.    Gilda Crease, MD 03/27/15 [phone]

## 2015-10-02 ENCOUNTER — Encounter (HOSPITAL_BASED_OUTPATIENT_CLINIC_OR_DEPARTMENT_OTHER): Payer: Self-pay

## 2015-10-02 ENCOUNTER — Emergency Department (HOSPITAL_BASED_OUTPATIENT_CLINIC_OR_DEPARTMENT_OTHER)
Admission: EM | Admit: 2015-10-02 | Discharge: 2015-10-02 | Disposition: A | Discharge status: Home / Self Care | Payer: Medicaid Other | Attending: Emergency Medicine | Admitting: Emergency Medicine

## 2015-10-02 DIAGNOSIS — Z3202 Encounter for pregnancy test, result negative: Secondary | ICD-10-CM | POA: Insufficient documentation

## 2015-10-02 DIAGNOSIS — R5383 Other fatigue: Secondary | ICD-10-CM | POA: Insufficient documentation

## 2015-10-02 DIAGNOSIS — R519 Headache, unspecified: Secondary | ICD-10-CM

## 2015-10-02 DIAGNOSIS — R000 Tachycardia, unspecified: Secondary | ICD-10-CM | POA: Insufficient documentation

## 2015-10-02 DIAGNOSIS — Z87891 Personal history of nicotine dependence: Secondary | ICD-10-CM | POA: Insufficient documentation

## 2015-10-02 DIAGNOSIS — R51 Headache: Secondary | ICD-10-CM

## 2015-10-02 DIAGNOSIS — M797 Fibromyalgia: Secondary | ICD-10-CM | POA: Insufficient documentation

## 2015-10-02 DIAGNOSIS — G43909 Migraine, unspecified, not intractable, without status migrainosus: Secondary | ICD-10-CM | POA: Insufficient documentation

## 2015-10-02 DIAGNOSIS — Z8701 Personal history of pneumonia (recurrent): Secondary | ICD-10-CM | POA: Insufficient documentation

## 2015-10-02 LAB — CBC WITH DIFFERENTIAL/PLATELET
BASOS ABS: 0 10*3/uL (ref 0.0–0.1)
BASOS PCT: 0 %
EOS PCT: 0 %
Eosinophils Absolute: 0 10*3/uL (ref 0.0–0.7)
HCT: 37.4 % (ref 36.0–46.0)
Hemoglobin: 12.5 g/dL (ref 12.0–15.0)
LYMPHS PCT: 14 %
Lymphs Abs: 1.1 10*3/uL (ref 0.7–4.0)
MCH: 33.2 pg (ref 26.0–34.0)
MCHC: 33.4 g/dL (ref 30.0–36.0)
MCV: 99.2 fL (ref 78.0–100.0)
MONO ABS: 0.7 10*3/uL (ref 0.1–1.0)
Monocytes Relative: 9 %
NEUTROS ABS: 6.2 10*3/uL (ref 1.7–7.7)
Neutrophils Relative %: 77 %
PLATELETS: 203 10*3/uL (ref 150–400)
RBC: 3.77 MIL/uL — ABNORMAL LOW (ref 3.87–5.11)
RDW: 12.4 % (ref 11.5–15.5)
WBC: 8 10*3/uL (ref 4.0–10.5)

## 2015-10-02 LAB — BASIC METABOLIC PANEL
ANION GAP: 6 (ref 5–15)
BUN: 10 mg/dL (ref 6–20)
CALCIUM: 8.8 mg/dL — AB (ref 8.9–10.3)
CO2: 25 mmol/L (ref 22–32)
Chloride: 109 mmol/L (ref 101–111)
Creatinine, Ser: 0.59 mg/dL (ref 0.44–1.00)
Glucose, Bld: 100 mg/dL — ABNORMAL HIGH (ref 65–99)
POTASSIUM: 4.4 mmol/L (ref 3.5–5.1)
Sodium: 140 mmol/L (ref 135–145)

## 2015-10-02 LAB — PREGNANCY, URINE: PREG TEST UR: NEGATIVE

## 2015-10-02 MED ORDER — IBUPROFEN 800 MG PO TABS: 800.0000 mg | ORAL_TABLET | Freq: Three times a day (TID) | ORAL | Status: DC

## 2015-10-02 MED ORDER — DIPHENHYDRAMINE HCL 50 MG/ML IJ SOLN
12.5000 mg | Freq: Once | INTRAMUSCULAR | Status: AC
  Administered 2015-10-02: 14:00:00 via INTRAVENOUS
  Filled 2015-10-02: qty 1

## 2015-10-02 MED ORDER — METOCLOPRAMIDE HCL 5 MG/ML IJ SOLN
10.0000 mg | Freq: Once | INTRAMUSCULAR | Status: AC
  Administered 2015-10-02: 10 mg via INTRAVENOUS
  Filled 2015-10-02: qty 2

## 2015-10-02 MED ORDER — KETOROLAC TROMETHAMINE 30 MG/ML IJ SOLN
30.0000 mg | Freq: Once | INTRAMUSCULAR | Status: AC
  Administered 2015-10-02: 30 mg via INTRAVENOUS
  Filled 2015-10-02: qty 1

## 2015-10-02 MED ORDER — ONDANSETRON 4 MG PO TBDP: 4.0000 mg | ORAL_TABLET | Freq: Three times a day (TID) | ORAL | Status: DC | PRN

## 2015-10-02 MED ORDER — DEXAMETHASONE SODIUM PHOSPHATE 10 MG/ML IJ SOLN
10.0000 mg | Freq: Once | INTRAMUSCULAR | Status: AC
  Administered 2015-10-02: 10 mg via INTRAVENOUS
  Filled 2015-10-02: qty 1

## 2015-10-02 MED ORDER — SODIUM CHLORIDE 0.9 % IV BOLUS (SEPSIS)
1000.0000 mL | Freq: Once | INTRAVENOUS | Status: AC
  Administered 2015-10-02: 1000 mL via INTRAVENOUS

## 2015-10-02 NOTE — ED Notes (Signed)
Pt requesting dilaudud for pain , PA made aware

## 2015-10-02 NOTE — ED Provider Notes (Signed)
CSN: [id number]     Arrival date & time 10/02/15  1204 History   First MD Initiated Contact with Patient 10/02/15 1241     Chief Complaint  Patient presents with  . Migraine     HPI   Nicole Mccoy is a 34 y.o. female with a PMH of migraines who presents to the ED with headache, which she states started around 6 AM this morning. She reports her pain is located diffusely across her forehead and is constant. She states light exacerbates her symptoms. She has tried tylenol and ibuprofen for symptom relief, however has felt nauseous and has not been able to tolerate these medications. She reports several episodes of nonbloody emesis. She states her symptoms feel characteristic of her typical migraine. She denies dizziness, lightheadedness, vision changes, chest pain, shortness of breath, abdominal pain, diarrhea, constipation, numbness, weakness, paresthesia.   Past Medical History  Diagnosis Date  . Migraine   . Fibromyalgia   . Raynaud's syndrome   . Pneumonia    Past Surgical History  Procedure Laterality Date  . Shoulder surgery     No family history on file. Social History  Substance Use Topics  . Smoking status: Former Smoker -- 0.00 packs/day  . Smokeless tobacco: Never Used  . Alcohol Use: Yes     Comment: rare   OB History    No data available      Review of Systems  Constitutional: Positive for fatigue. Negative for fever and chills.  Respiratory: Negative for shortness of breath.   Cardiovascular: Negative for chest pain.  Gastrointestinal: Positive for nausea and vomiting. Negative for abdominal pain, diarrhea and constipation.  Musculoskeletal: Negative for neck pain and neck stiffness.  Neurological: Positive for headaches. Negative for dizziness, syncope, weakness, light-headedness and numbness.  All other systems reviewed and are negative.     Allergies  Ibuprofen; Lorazepam; and Sulfa antibiotics  Home Medications   Prior to Admission medications    Medication Sig Start Date End Date Taking? Authorizing Provider  Citalopram Hydrobromide (CELEXA PO) Take by mouth.    Historical Provider, MD  clonazePAM (KLONOPIN) 1 MG tablet Take 1 mg by mouth 2 (two) times daily as needed.    Historical Provider, MD  cyclobenzaprine (FLEXERIL) 10 MG tablet Take 1 tablet (10 mg total) by mouth 3 (three) times daily as needed for muscle spasms. 08/04/13   Susy Frizzleharles Sheldon, MD  ibuprofen (ADVIL,MOTRIN) 800 MG tablet Take 1 tablet (800 mg total) by mouth 3 (three) times daily. 10/02/15   Mady GemmaElizabeth C Westfall, PA-C  ondansetron (ZOFRAN ODT) 4 MG disintegrating tablet Take 1 tablet (4 mg total) by mouth every 8 (eight) hours as needed for nausea. 10/02/15   Mady GemmaElizabeth C Westfall, PA-C  promethazine (PHENERGAN) 25 MG tablet Take 1 tablet (25 mg total) by mouth every 6 (six) hours as needed for nausea or vomiting. 03/26/15   Gilda Creasehristopher J Pollina, MD  Topiramate (TOPAMAX PO) Take by mouth.    Historical Provider, MD  traMADol (ULTRAM) 50 MG tablet Take 50 mg by mouth every 6 (six) hours as needed.    Historical Provider, MD    BP 110/83 mmHg  Pulse 98  Temp(Src) 98.3 F (36.8 C) (Oral)  Resp 16  Ht 5\' 4"  (1.626 m)  Wt 115 lb (52.164 kg)  BMI 19.73 kg/m2  SpO2 100%  LMP 09/15/2015 Physical Exam  Constitutional: She is oriented to person, place, and time. She appears well-developed and well-nourished. No distress.  HENT:  Head: Normocephalic  and atraumatic.  Right Ear: External ear normal.  Left Ear: External ear normal.  Nose: Nose normal.  Mouth/Throat: Uvula is midline, oropharynx is clear and moist and mucous membranes are normal.  Eyes: Conjunctivae, EOM and lids are normal. Pupils are equal, round, and reactive to light. Right eye exhibits no discharge. Left eye exhibits no discharge. No scleral icterus.  Neck: Normal range of motion. Neck supple.  Cardiovascular: Regular rhythm, normal heart sounds, intact distal pulses and normal pulses.  Tachycardia  present.   Pulmonary/Chest: Effort normal and breath sounds normal. No respiratory distress. She has no wheezes. She has no rales.  Abdominal: Soft. Normal appearance and bowel sounds are normal. She exhibits no distension and no mass. There is no tenderness. There is no rigidity, no rebound and no guarding.  Musculoskeletal: Normal range of motion. She exhibits no edema or tenderness.  Neurological: She is alert and oriented to person, place, and time. She has normal strength. No cranial nerve deficit or sensory deficit.  Skin: Skin is warm, dry and intact. No rash noted. She is not diaphoretic. No erythema. No pallor.  Psychiatric: She has a normal mood and affect. Her speech is normal and behavior is normal.  Nursing note and vitals reviewed.   ED Course  Procedures (including critical care time)  Labs Review Labs Reviewed  CBC WITH DIFFERENTIAL/PLATELET - Abnormal; Notable for the following:    RBC 3.77 (*)    All other components within normal limits  BASIC METABOLIC PANEL - Abnormal; Notable for the following:    Glucose, Bld 100 (*)    Calcium 8.8 (*)    All other components within normal limits  PREGNANCY, URINE    Imaging Review No results found.   I have personally reviewed and evaluated these lab results as part of my medical decision-making.   EKG Interpretation None      MDM   Final diagnoses:  Headache, unspecified headache type    34 year old female presents with headache, which she states started at 6 AM this morning. Reports associated photophobia, nausea, and vomiting. States her symptoms are characteristic of her typical migraine. Denies dizziness, lightheadedness, vision changes, chest pain, shortness of breath, abdominal pain, diarrhea, constipation, numbness, weakness, paresthesia. Patient is afebrile. Initially tachycardic. Heart regular rhythm. Lungs clear to auscultation bilaterally. Abdomen soft, nontender, nondistended. Normal neuro exam with no  focal deficit. No nuchal rigidity.  Will treat with IV fluids and migraine cocktail. CBC negative for leukocytosis or anemia. BMP unremarkable. Urine pregnancy negative.  Patient reports significant symptom improvement in the emergency department. She is well-appearing and nontoxic. Heart rate 98. Doubt ICH, SAH, meningitis. Feel patient is stable for discharge at this time. Return precautions discussed. Will give ibuprofen and zofran. Patient to follow up with PCP for further evaluation and management of migraines.  BP 110/83 mmHg  Pulse 98  Temp(Src) 98.3 F (36.8 C) (Oral)  Resp 16  Ht  (1.626 m)  Wt 115 lb (52.164 kg)  BMI 19.73 kg/m2  SpO2 100%  LMP 09/15/2015     Mady Gemma, PA-C 10/02/15 1642  Arby Barrette, MD 10/03/15 [phone]

## 2015-10-02 NOTE — ED Notes (Signed)
Migraine, n/v since 6am

## 2015-10-02 NOTE — Discharge Instructions (Signed)
1. Medications: ibuprofen, zofran, usual home medications 2. Treatment: rest, drink plenty of fluids 3. Follow Up: please followup with your primary doctor this week for discussion of your diagnoses and further evaluation after today's visit; please return to the ER for severe headache, high fever, new or worsening symptoms   Migraine Headache A migraine headache is an intense, throbbing pain on one or both sides of your head. A migraine can last for 30 minutes to several hours. CAUSES  The exact cause of a migraine headache is not always known. However, a migraine may be caused when nerves in the brain become irritated and release chemicals that cause inflammation. This causes pain. Certain things may also trigger migraines, such as:  Alcohol.  Smoking.  Stress.  Menstruation.  Aged cheeses.  Foods or drinks that contain nitrates, glutamate, aspartame, or tyramine.  Lack of sleep.  Chocolate.  Caffeine.  Hunger.  Physical exertion.  Fatigue.  Medicines used to treat chest pain (nitroglycerine), birth control pills, estrogen, and some blood pressure medicines. SIGNS AND SYMPTOMS  Pain on one or both sides of your head.  Pulsating or throbbing pain.  Severe pain that prevents daily activities.  Pain that is aggravated by any physical activity.  Nausea, vomiting, or both.  Dizziness.  Pain with exposure to bright lights, loud noises, or activity.  General sensitivity to bright lights, loud noises, or smells. Before you get a migraine, you may get warning signs that a migraine is coming (aura). An aura may include:  Seeing flashing lights.  Seeing bright spots, halos, or zigzag lines.  Having tunnel vision or blurred vision.  Having feelings of numbness or tingling.  Having trouble talking.  Having muscle weakness. DIAGNOSIS  A migraine headache is often diagnosed based on:  Symptoms.  Physical exam.  A CT scan or MRI of your head. These imaging  tests cannot diagnose migraines, but they can help rule out other causes of headaches. TREATMENT Medicines may be given for pain and nausea. Medicines can also be given to help prevent recurrent migraines.  HOME CARE INSTRUCTIONS  Only take over-the-counter or prescription medicines for pain or discomfort as directed by your health care provider. The use of long-term narcotics is not recommended.  Lie down in a dark, quiet room when you have a migraine.  Keep a journal to find out what may trigger your migraine headaches. For example, write down:  What you eat and drink.  How much sleep you get.  Any change to your diet or medicines.  Limit alcohol consumption.  Quit smoking if you smoke.  Get 7-9 hours of sleep, or as recommended by your health care provider.  Limit stress.  Keep lights dim if bright lights bother you and make your migraines worse. SEEK IMMEDIATE MEDICAL CARE IF:   Your migraine becomes severe.  You have a fever.  You have a stiff neck.  You have vision loss.  You have muscular weakness or loss of muscle control.  You start losing your balance or have trouble walking.  You feel faint or pass out.  You have severe symptoms that are different from your first symptoms. MAKE SURE YOU:   Understand these instructions.  Will watch your condition.  Will get help right away if you are not doing well or get worse.   This information is not intended to replace advice given to you by your health care provider. Make sure you discuss any questions you have with your health care provider.  Document Released: 11/18/2005 Document Revised: 12/09/2014 Document Reviewed: 07/26/2013 Elsevier Interactive Patient Education [employer]2016 [employer] Inc[employer]

## 2016-02-05 ENCOUNTER — Encounter (HOSPITAL_BASED_OUTPATIENT_CLINIC_OR_DEPARTMENT_OTHER): Payer: Self-pay | Admitting: Emergency Medicine

## 2016-02-05 ENCOUNTER — Emergency Department (HOSPITAL_BASED_OUTPATIENT_CLINIC_OR_DEPARTMENT_OTHER)
Admission: EM | Admit: 2016-02-05 | Discharge: 2016-02-05 | Disposition: A | Discharge status: Home / Self Care | Payer: Medicaid Other | Attending: Emergency Medicine | Admitting: Emergency Medicine

## 2016-02-05 DIAGNOSIS — Z8679 Personal history of other diseases of the circulatory system: Secondary | ICD-10-CM | POA: Insufficient documentation

## 2016-02-05 DIAGNOSIS — Z87891 Personal history of nicotine dependence: Secondary | ICD-10-CM | POA: Insufficient documentation

## 2016-02-05 DIAGNOSIS — R110 Nausea: Secondary | ICD-10-CM | POA: Insufficient documentation

## 2016-02-05 DIAGNOSIS — Z3202 Encounter for pregnancy test, result negative: Secondary | ICD-10-CM | POA: Insufficient documentation

## 2016-02-05 DIAGNOSIS — G43909 Migraine, unspecified, not intractable, without status migrainosus: Secondary | ICD-10-CM | POA: Insufficient documentation

## 2016-02-05 DIAGNOSIS — Z79899 Other long term (current) drug therapy: Secondary | ICD-10-CM | POA: Insufficient documentation

## 2016-02-05 DIAGNOSIS — Z8701 Personal history of pneumonia (recurrent): Secondary | ICD-10-CM | POA: Insufficient documentation

## 2016-02-05 DIAGNOSIS — R109 Unspecified abdominal pain: Secondary | ICD-10-CM

## 2016-02-05 DIAGNOSIS — R10A0 Flank pain, unspecified side: Secondary | ICD-10-CM

## 2016-02-05 DIAGNOSIS — M797 Fibromyalgia: Secondary | ICD-10-CM | POA: Insufficient documentation

## 2016-02-05 DIAGNOSIS — R140 Abdominal distension (gaseous): Secondary | ICD-10-CM | POA: Insufficient documentation

## 2016-02-05 LAB — BASIC METABOLIC PANEL
ANION GAP: 8 (ref 5–15)
BUN: 12 mg/dL (ref 6–20)
CALCIUM: 8.5 mg/dL — AB (ref 8.9–10.3)
CO2: 21 mmol/L — AB (ref 22–32)
CREATININE: 0.66 mg/dL (ref 0.44–1.00)
Chloride: 110 mmol/L (ref 101–111)
GFR calc Af Amer: >60 mL/min (ref >60)
GFR calc non Af Amer: >60 mL/min (ref >60)
GLUCOSE: 92 mg/dL (ref 65–99)
Potassium: 3.6 mmol/L (ref 3.5–5.1)
Sodium: 139 mmol/L (ref 135–145)

## 2016-02-05 LAB — URINALYSIS, ROUTINE W REFLEX MICROSCOPIC
Bilirubin Urine: NEGATIVE
Glucose, UA: NEGATIVE mg/dL
HGB URINE DIPSTICK: NEGATIVE
Ketones, ur: NEGATIVE mg/dL
Leukocytes, UA: NEGATIVE
Nitrite: NEGATIVE
PROTEIN: NEGATIVE mg/dL
Specific Gravity, Urine: 1.01 (ref 1.005–1.030)
pH: 7 (ref 5.0–8.0)

## 2016-02-05 LAB — CBC WITH DIFFERENTIAL/PLATELET
BASOS ABS: 0 10*3/uL (ref 0.0–0.1)
BASOS PCT: 1 %
EOS PCT: 2 %
Eosinophils Absolute: 0.1 10*3/uL (ref 0.0–0.7)
HEMATOCRIT: 37.8 % (ref 36.0–46.0)
Hemoglobin: 12.5 g/dL (ref 12.0–15.0)
LYMPHS PCT: 29 %
Lymphs Abs: 1.2 10*3/uL (ref 0.7–4.0)
MCH: 33.3 pg (ref 26.0–34.0)
MCHC: 33.1 g/dL (ref 30.0–36.0)
MCV: 100.8 fL — AB (ref 78.0–100.0)
MONO ABS: 0.6 10*3/uL (ref 0.1–1.0)
MONOS PCT: 15 %
NEUTROS ABS: 2.3 10*3/uL (ref 1.7–7.7)
Neutrophils Relative %: 53 %
PLATELETS: 188 10*3/uL (ref 150–400)
RBC: 3.75 MIL/uL — ABNORMAL LOW (ref 3.87–5.11)
RDW: 13 % (ref 11.5–15.5)
WBC: 4.3 10*3/uL (ref 4.0–10.5)

## 2016-02-05 LAB — PREGNANCY, URINE: PREG TEST UR: NEGATIVE

## 2016-02-05 MED ORDER — METHOCARBAMOL 500 MG PO TABS: 500.0000 mg | ORAL_TABLET | Freq: Two times a day (BID) | ORAL | Status: DC

## 2016-02-05 MED ORDER — ONDANSETRON 4 MG PO TBDP
4.0000 mg | ORAL_TABLET | Freq: Once | ORAL | Status: AC
  Administered 2016-02-05: 4 mg via ORAL
  Filled 2016-02-05: qty 1

## 2016-02-05 MED ORDER — HYDROCODONE-ACETAMINOPHEN 5-325 MG PO TABS
1.0000 | ORAL_TABLET | Freq: Once | ORAL | Status: DC
  Filled 2016-02-05: qty 1

## 2016-02-05 MED FILL — METHOCARBAMOL 500 MG TABLET: 500 | 5 days supply | Qty: 10 | Fill #0

## 2016-02-05 NOTE — ED Notes (Signed)
Pt upset stating no one has checked on her or updated her on poc, I personally have checked on patient multiple times and updated poc

## 2016-02-05 NOTE — ED Notes (Signed)
Went to discharge pt,  Pt upset, wanting to speak to provider.  Informed PA that patient was upset, wanting to speak with her.

## 2016-02-05 NOTE — ED Notes (Signed)
Pt states she feels better now that the PA has spoken to her about her test results.  Pt ready for discharge.

## 2016-02-05 NOTE — Discharge Instructions (Signed)
Flank Pain °Flank pain refers to pain that is located on the side of the body between the upper abdomen and the back. The pain may occur over a short period of time (acute) or may be long-term or reoccurring (chronic). It may be mild or severe. Flank pain can be caused by many things. °CAUSES  °Some of the more common causes of flank pain include: °· Muscle strains.   °· Muscle spasms.   °· A disease of your spine (vertebral disk disease).   °· A lung infection (pneumonia).   °· Fluid around your lungs (pulmonary edema).   °· A kidney infection.   °· Kidney stones.   °· A very painful skin rash caused by the chickenpox virus (shingles).   °· Gallbladder disease.   °HOME CARE INSTRUCTIONS  °Home care will depend on the cause of your pain. In general, °· Rest as directed by your caregiver. °· Drink enough fluids to keep your urine clear or pale yellow. °· Only take over-the-counter or prescription medicines as directed by your caregiver. Some medicines may help relieve the pain. °· Tell your caregiver about any changes in your pain. °· Follow up with your caregiver as directed. °SEEK IMMEDIATE MEDICAL CARE IF:  °· Your pain is not controlled with medicine.   °· You have new or worsening symptoms. °· Your pain increases.   °· You have abdominal pain.   °· You have shortness of breath.   °· You have persistent nausea or vomiting.   °· You have swelling in your abdomen.   °· You feel faint or pass out.   °· You have blood in your urine. °· You have a fever or persistent symptoms for more than 2-3 days. °· You have a fever and your symptoms suddenly get worse. °MAKE SURE YOU:  °· Understand these instructions. °· Will watch your condition. °· Will get help right away if you are not doing well or get worse. °  °This information is not intended to replace advice given to you by your health care provider. Make sure you discuss any questions you have with your health care provider. °  °Document Released: 01/09/2006 Document  Revised: 08/12/2012 Document Reviewed: 07/02/2012 °Elsevier Interactive Patient Education ©2016 Elsevier Inc. ° °

## 2016-02-05 NOTE — ED Provider Notes (Signed)
CSN: [id number]     Arrival date & time 02/05/16  [id number] History   First MD Initiated Contact with Patient 02/05/16 [phone]     Chief Complaint  Patient presents with  . Flank Pain   HPI  Nicole Mccoy is a 35 year old female with PMHx of fibromyalgia presenting with flank pain. Onset was 2 days ago. She states the pain woke her from sleep. She describes it as an intermittent sharp and throbbing pain. The pain radiates towards the left lower abdomen. Associated with mild nausea but no vomiting. She state that the pain is so severe that she became nauseous. She took tylenol and tramadol which did not improve the pain. The flank pain has become more constant since onset. She states that it feels like the pain is now on the right flank as well and she describes it as a soreness. She reports umbilical abdominal pain associated with the flank pain last evening but this has resolved. She denies associated fever, chills, headache, dizziness, syncope, chest pain, diarrhea, constipation, dysuria, hematuria, increased frequency, vaginal discharge or pelvic pain. She states that her abdomen felt more bloated than usual when she woke this morning. She denies personal history of kidney stones but state that her sister gets them. She denies recent trauma to the back. She has no other complaints today.   Past Medical History  Diagnosis Date  . Migraine   . Fibromyalgia   . Raynaud's syndrome   . Pneumonia    Past Surgical History  Procedure Laterality Date  . Shoulder surgery     No family history on file. Social History  Substance Use Topics  . Smoking status: Former Smoker -- 0.00 packs/day  . Smokeless tobacco: Never Used  . Alcohol Use: Yes     Comment: rare   OB History    No data available     Review of Systems  Constitutional: Negative for fever and chills.  Gastrointestinal: Positive for nausea, abdominal pain and abdominal distention. Negative for vomiting and diarrhea.  Genitourinary: Positive  for flank pain. Negative for dysuria, frequency, hematuria, vaginal discharge and difficulty urinating.  Musculoskeletal: Negative for myalgias.  Neurological: Negative for dizziness, syncope and headaches.  All other systems reviewed and are negative.     Allergies  Ibuprofen; Lorazepam; and Sulfa antibiotics  Home Medications   Prior to Admission medications   Medication Sig Start Date End Date Taking? Authorizing Provider  Citalopram Hydrobromide (CELEXA PO) Take by mouth.   Yes Historical Provider, MD  clonazePAM (KLONOPIN) 1 MG tablet Take 1 mg by mouth 2 (two) times daily as needed.   Yes Historical Provider, MD  Topiramate (TOPAMAX PO) Take by mouth.   Yes Historical Provider, MD  traMADol (ULTRAM) 50 MG tablet Take 50 mg by mouth every 6 (six) hours as needed.   Yes Historical Provider, MD  methocarbamol (ROBAXIN) 500 MG tablet Take 1 tablet (500 mg total) by mouth 2 (two) times daily. 02/05/16   Jonthan Leite, PA-C   BP 108/86 mmHg  Pulse 108  Temp(Src) 99 F (37.2 C) (Oral)  Resp 16  SpO2 100%  LMP 01/28/2016 Physical Exam  Constitutional: She appears well-developed and well-nourished.  Non-toxic appearance. No distress.  Nontoxic appearing  HENT:  Head: Normocephalic and atraumatic.  Eyes: Conjunctivae are normal. Right eye exhibits no discharge. Left eye exhibits no discharge. No scleral icterus.  Neck: Normal range of motion.  Cardiovascular: Normal rate, regular rhythm, normal heart sounds and intact distal pulses.  Pulmonary/Chest: Effort normal and breath sounds normal. No respiratory distress.  Abdominal: Soft. Bowel sounds are normal. She exhibits no distension. There is no tenderness. There is no rebound and no guarding.  Abdomen is soft and non-tender. No rebound or guarding. No CVA tenderness.   Musculoskeletal: Normal range of motion.  Mild tenderness over the thoracic region. No focal tenderness over the thoracic or lumbar spine. FROM of the back intact.  FROM of all extremities and pt walks with a steady gait.   Neurological: She is alert. Coordination normal.  5/5 strength of BLE. Sensation to light touch intact throughout.   Skin: Skin is warm and dry. No rash noted.  No rash noted over the flanks  Psychiatric: She has a normal mood and affect. Her behavior is normal.  Nursing note and vitals reviewed.   ED Course  Procedures (including critical care time) Labs Review Labs Reviewed  CBC WITH DIFFERENTIAL/PLATELET - Abnormal; Notable for the following:    RBC 3.75 (*)    MCV 100.8 (*)    All other components within normal limits  BASIC METABOLIC PANEL - Abnormal; Notable for the following:    CO2 21 (*)    Calcium 8.5 (*)    All other components within normal limits  PREGNANCY, URINE  URINALYSIS, ROUTINE W REFLEX MICROSCOPIC (NOT AT [facility])    Imaging Review No results found. I have personally reviewed and evaluated these images and lab results as part of my medical decision-making.   EKG Interpretation None      MDM   Final diagnoses:  Flank pain   35 year old female presenting with flank pain 2 days. No associated urinary symptoms. Mild nausea. Pt is hemodynamically stable and in no acute distress. Abdomen is soft, non-tender with no peritoneal signs. No CVA tenderness. Mild thoracic back TTP. No rashes present. CBC and BMET unremarkable. UA without blood or infection. Pregnancy negative. Unlikely to be kidney stone with lack of blood in urine. Patient upset that she got zofran instead of phenergan. Pt states she wishes to be discharged home because she is not receiving narcotics. Discussed with patient that since she drove, she cannot receive narcotic medications. She has tramadol at home and I have encouraged pt to use this for pain control. Given lab and urine results, I do not feel further work up is indicated at this time. Pain seems musculoskeletal in origin. She admits to picking up her 13 year old child frequently and  believes this may be source of her pain. Discussed conservative pain treatment options including heat, ice and OTC medications. I have also discussed reasons to return immediately to the ED. Return precautions given in discharge paperwork and discussed with pt at bedside. Pt stable for discharge     Alveta Heimlich, PA-C 02/05/16 1206  Glynn Octave, MD 02/05/16 [phone]

## 2016-02-05 NOTE — ED Notes (Signed)
Left flank pain for two days.  Pain radiating to central front abdominal area.  Pt is now having bilateral flank pain.  Pt states she feels bloated.  Some chills yesterday.

## 2016-04-03 ENCOUNTER — Emergency Department (HOSPITAL_BASED_OUTPATIENT_CLINIC_OR_DEPARTMENT_OTHER): Payer: BLUE CROSS/BLUE SHIELD

## 2016-04-03 ENCOUNTER — Encounter (HOSPITAL_BASED_OUTPATIENT_CLINIC_OR_DEPARTMENT_OTHER): Payer: Self-pay | Admitting: *Deleted

## 2016-04-03 ENCOUNTER — Emergency Department (HOSPITAL_BASED_OUTPATIENT_CLINIC_OR_DEPARTMENT_OTHER)
Admission: EM | Admit: 2016-04-03 | Discharge: 2016-04-03 | Disposition: A | Discharge status: Home / Self Care | Payer: BLUE CROSS/BLUE SHIELD | Attending: Emergency Medicine | Admitting: Emergency Medicine

## 2016-04-03 DIAGNOSIS — M25571 Pain in right ankle and joints of right foot: Secondary | ICD-10-CM

## 2016-04-03 DIAGNOSIS — Z79899 Other long term (current) drug therapy: Secondary | ICD-10-CM | POA: Insufficient documentation

## 2016-04-03 DIAGNOSIS — Z87891 Personal history of nicotine dependence: Secondary | ICD-10-CM | POA: Diagnosis not present

## 2016-04-03 DIAGNOSIS — M25471 Effusion, right ankle: Secondary | ICD-10-CM | POA: Diagnosis not present

## 2016-04-03 MED ORDER — ACETAMINOPHEN 325 MG PO TABS
650.0000 mg | ORAL_TABLET | Freq: Once | ORAL | Status: AC
  Administered 2016-04-03: 650 mg via ORAL
  Filled 2016-04-03: qty 2

## 2016-04-03 NOTE — ED Notes (Signed)
Pt placed on auto vitals Q30.  
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
## 2016-04-03 NOTE — ED Notes (Signed)
Pt c/o leg pain x 1 week with injury

## 2017-07-17 IMAGING — DX DG ANKLE COMPLETE 3+V*R*
3 series · 3 of 3 positions shown · non-contrast
Comparison: None.

CLINICAL DATA: Bruising and pain at the right ankle secondary to a
fall 3 weeks ago.

EXAM:
RIGHT ANKLE - COMPLETE 3+ VIEW

[ankle ap]
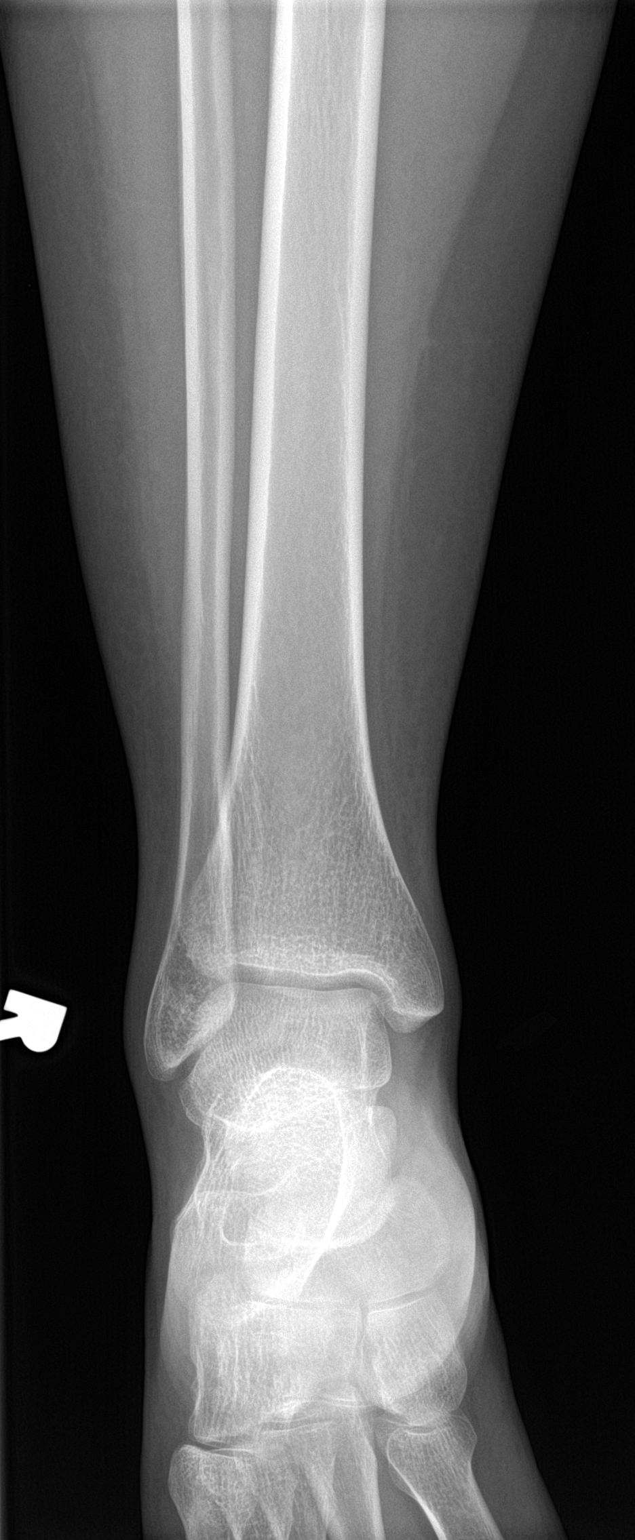

[ankle obl]
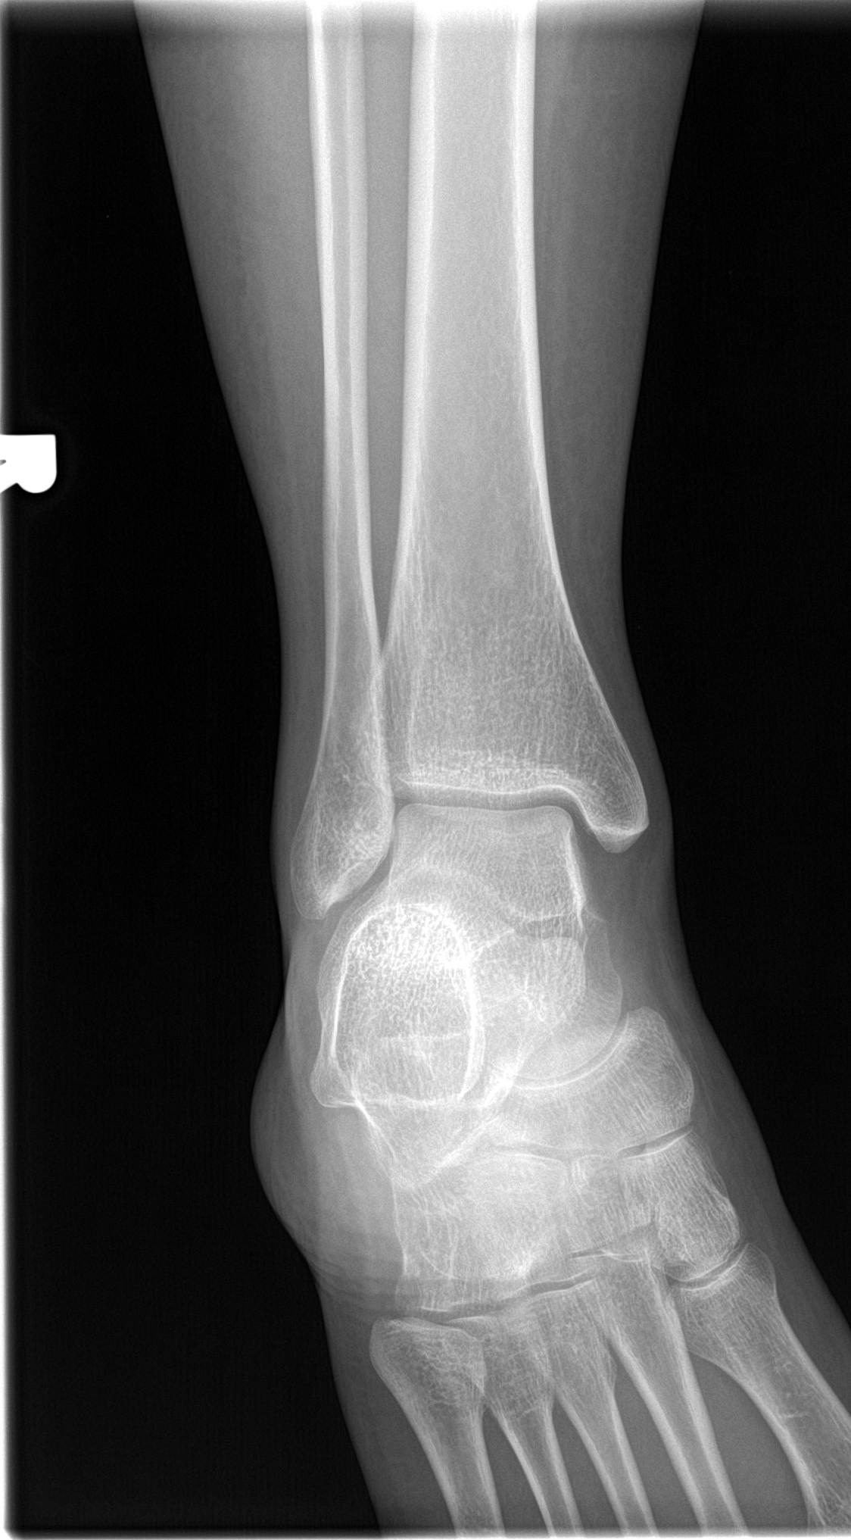

[ankle lat]
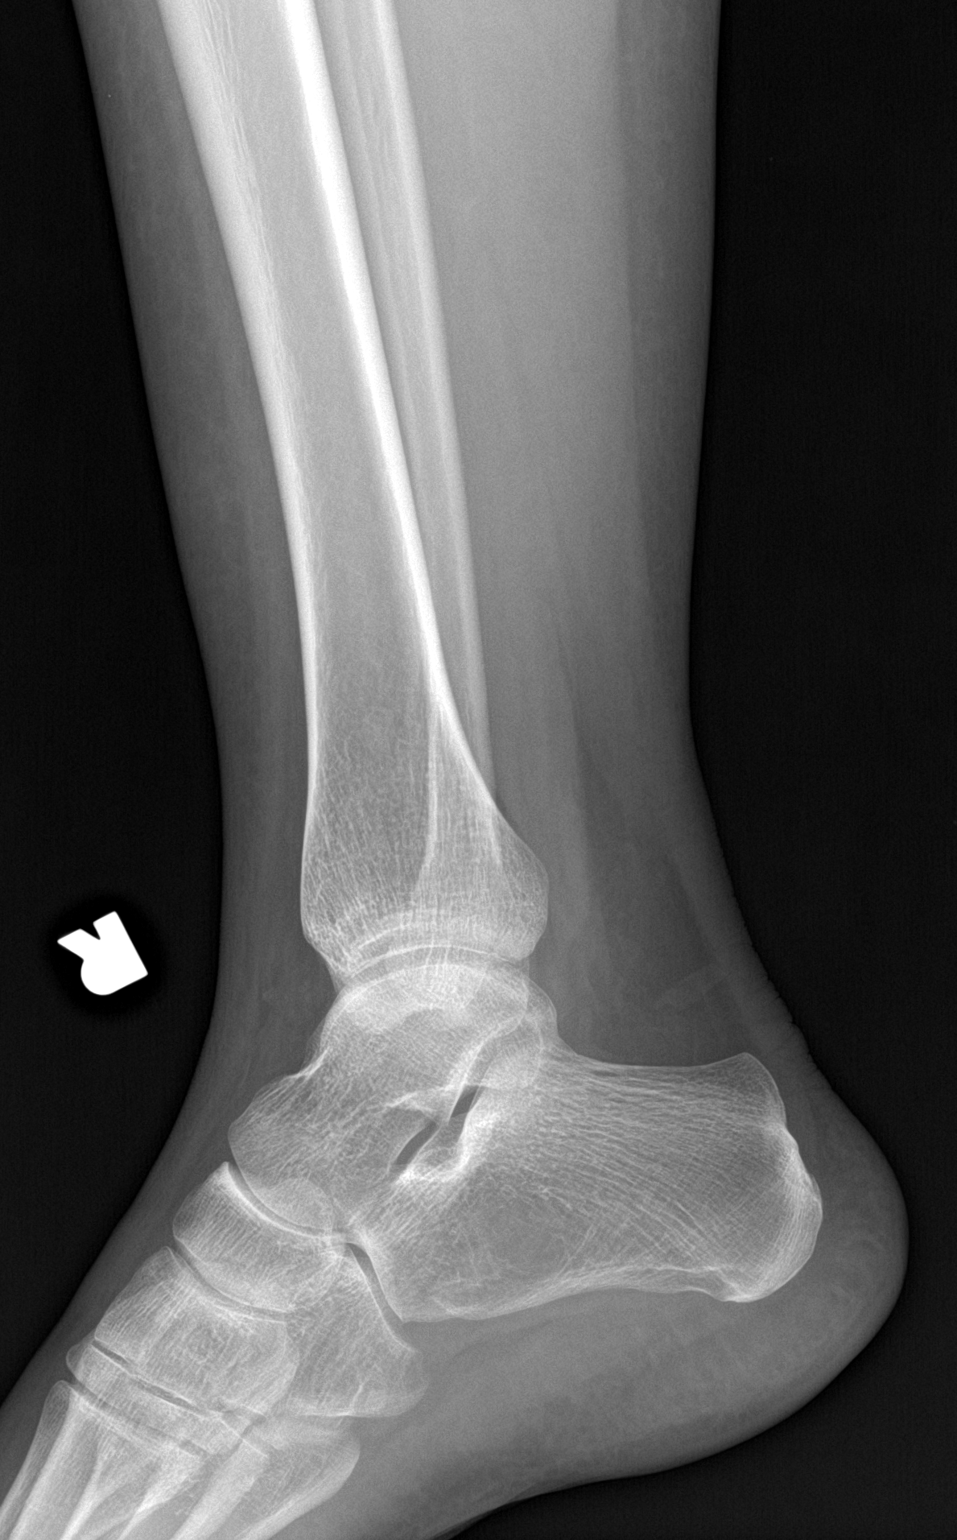

[3 of 3 positions shown; findings below may reference images not displayed]

FINDINGS: There is no evidence of fracture, dislocation, or joint effusion.
There is no evidence of arthropathy or other focal bone abnormality.
Soft tissues are unremarkable.
IMPRESSION: Negative.
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
## 2018-09-23 ENCOUNTER — Emergency Department (HOSPITAL_BASED_OUTPATIENT_CLINIC_OR_DEPARTMENT_OTHER)
Admission: EM | Admit: 2018-09-23 | Discharge: 2018-09-23 | Disposition: A | Discharge status: Home / Self Care | Payer: BLUE CROSS/BLUE SHIELD | Attending: Emergency Medicine | Admitting: Emergency Medicine

## 2018-09-23 ENCOUNTER — Other Ambulatory Visit: Payer: Self-pay

## 2018-09-23 ENCOUNTER — Encounter (HOSPITAL_BASED_OUTPATIENT_CLINIC_OR_DEPARTMENT_OTHER): Payer: Self-pay | Admitting: *Deleted

## 2018-09-23 DIAGNOSIS — G510 Bell's palsy: Secondary | ICD-10-CM | POA: Diagnosis not present

## 2018-09-23 DIAGNOSIS — Z79899 Other long term (current) drug therapy: Secondary | ICD-10-CM | POA: Diagnosis not present

## 2018-09-23 DIAGNOSIS — Z87891 Personal history of nicotine dependence: Secondary | ICD-10-CM | POA: Insufficient documentation

## 2018-09-23 DIAGNOSIS — R200 Anesthesia of skin: Secondary | ICD-10-CM | POA: Diagnosis present

## 2018-09-23 MED ORDER — PREDNISONE 20 MG PO TABS: ORAL_TABLET | ORAL | 0 refills | Status: DC

## 2018-09-23 MED ORDER — VALACYCLOVIR HCL 1 G PO TABS: 1000.0000 mg | ORAL_TABLET | Freq: Three times a day (TID) | ORAL | 0 refills | Status: DC

## 2018-09-23 MED FILL — valACYclovir HCL 1 GM TABS: 1 | 7 days supply | Qty: 21 | Fill #0

## 2018-09-23 MED FILL — predniSONE 20 MG TABS: 20 | 5 days supply | Qty: 11 | Fill #0

## 2018-09-23 NOTE — ED Provider Notes (Signed)
[facility] EMERGENCY DEPARTMENT Provider Note   CSN: [id number] Arrival date & time: 09/23/18  1442     History   Chief Complaint Chief Complaint  Patient presents with  . Otalgia    HPI Nicole Mccoy is a 37 y.o. female.  The history is provided by the patient and medical records. No language interpreter was used.  Otalgia      37 year old female with hx of fibromyalgia, Raynaud's syndrome presenting c/o facial pain and numbness. Patient has had recurrent issues of right ear pain as well as right facial paresthesia.  She has been seen multiple providers for symptoms, most recently evaluated by ear nose and throat specialist, Dr. Christell Constant on 10/18.  Per ENTs note, the exam is unremarkable and he felt the pain could be coming from jaw.  Recommend avoid clenching, and states physical therapy is an option however patient was not interested. She was also evaluated on 10/14 at [facility] for same, was given a shot of steroid along with toradol which provides no resolution of her condition.  She reached out to her neurologist yesterday but was told this is not a neurological problem.  She called her PCP today but was told to come to the ER.  Patient mention currently she experienced a very localized area on the right side of her lower lip and chin that radiates towards her ear, it is painful with tingling sensation.  The team sensation did seem to improve since yesterday but is still present.  No report of fever, vision changes, hearing changes, tinnitus, tongue pain or numbness, trouble swallowing, neck pain or rash.  No recent sickness.  History of migraine in the past and states she has had extensive treatment for her migraine and her neurologist.  Past Medical History:  Diagnosis Date  . Fibromyalgia   . Migraine   . Pneumonia   . Raynaud's syndrome     Patient Active Problem List   Diagnosis Date Noted  . Rash 04/10/2012  . Fibromyalgia 04/10/2012  . Migraine 04/10/2012     Past Surgical History:  Procedure Laterality Date  . SHOULDER SURGERY       OB History   None      Home Medications    Prior to Admission medications   Medication Sig Start Date End Date Taking? Authorizing Provider  Citalopram Hydrobromide (CELEXA PO) Take by mouth.    [provider]  clonazePAM (KLONOPIN) 1 MG tablet Take 1 mg by mouth 2 (two) times daily as needed.    [provider]  cyclobenzaprine (FLEXERIL) 10 MG tablet Take 10 mg by mouth 3 (three) times daily as needed for muscle spasms.    [provider]  Topiramate (TOPAMAX PO) Take by mouth.    [provider]  traMADol (ULTRAM) 50 MG tablet Take 50 mg by mouth every 6 (six) hours as needed.    [provider]    Family History History reviewed. No pertinent family history.  Social History Social History   Tobacco Use  . Smoking status: Former Smoker    Packs/day: 0.00  . Smokeless tobacco: Never Used  Substance Use Topics  . Alcohol use: Yes    Comment: rare  . Drug use: No     Allergies   Ibuprofen; Lorazepam; and Sulfa antibiotics   Review of Systems Review of Systems  HENT: Positive for ear pain.   Neurological: Positive for numbness.  All other systems reviewed and are negative.  Physical Exam Updated Vital Signs BP 128/78   Pulse 97   Temp 98.4 F (36.9 C)   Resp 18   Ht 5\' 4"  (1.626 m)   Wt 59.9 kg   SpO2 100%   BMI 22.66 kg/m   Physical Exam  Constitutional: She is oriented to person, place, and time. She appears well-developed and well-nourished. No distress.  Patient is well-appearing nontoxic in no acute discomfort.  HENT:  Head: Atraumatic.  Ears: TMs normal bilaterally Nose: Normal nares Throat: Uvula midline no tonsillar enlargement or exudates, normal dentition without dental pain. Face: A localized quarter size area in the right lower lip and chin with subjective paresthesia.  No overlying skin changes.  No facial  droops, and no evidence of infection.  Normal phonation.   Eyes: Conjunctivae are normal.  Neck: Neck supple.  Cardiovascular: Normal rate and regular rhythm.  Pulmonary/Chest: Effort normal and breath sounds normal.  Abdominal: Soft. There is no tenderness.  Neurological: She is alert and oriented to person, place, and time. A sensory deficit (mild decreased sensation to R side of lower lip and mandibular region.  normal phonation, no facial droop) is present. No cranial nerve deficit. GCS eye subscore is 4. GCS verbal subscore is 5. GCS motor subscore is 6.  Skin: No rash noted.  Psychiatric: She has a normal mood and affect.  Nursing note and vitals reviewed.    ED Treatments / Results  Labs (all labs ordered are listed, but only abnormal results are displayed) Labs Reviewed - No data to display  EKG None  Radiology No results found.  Procedures Procedures (including critical care time)  Medications Ordered in ED Medications - No data to display   Initial Impression / Assessment and Plan / ED Course  I have reviewed the triage vital signs and the nursing notes.  Pertinent labs & imaging results that were available during my care of the patient were reviewed by me and considered in my medical decision making (see chart for details).     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  No evidence to suggest Bell's palsy.  Sxs ongoing for 2 weeks, doubt stroke.  No evidence  of infection or dental pain.  Does have hx of migraine.  Has been seen by multiple providers.  Plan to provide a short course of prednisone and antiviral as this could be neuropraxia/paresthesia from residual Bell's palsy.  Encourage pt to f/u with neurology.     Fayrene Helper, PA-C 09/23/18 1528    Blane Ohara, MD 09/23/18 [phone]

## 2018-09-23 NOTE — ED Triage Notes (Signed)
Pt c/o right ear pain x 1 week, seen by UC and ENT for same, referred to neuro.

## 2018-09-23 NOTE — Discharge Instructions (Signed)
Please follow up with your primary care provider or your neurologist for further evaluation of your condition.

## 2019-02-03 ENCOUNTER — Encounter (HOSPITAL_BASED_OUTPATIENT_CLINIC_OR_DEPARTMENT_OTHER): Payer: Self-pay | Admitting: Emergency Medicine

## 2019-02-03 ENCOUNTER — Other Ambulatory Visit: Payer: Self-pay

## 2019-02-03 ENCOUNTER — Emergency Department (HOSPITAL_BASED_OUTPATIENT_CLINIC_OR_DEPARTMENT_OTHER)
Admission: EM | Admit: 2019-02-03 | Discharge: 2019-02-03 | Disposition: A | Discharge status: Home / Self Care | Payer: BLUE CROSS/BLUE SHIELD | Attending: Emergency Medicine | Admitting: Emergency Medicine

## 2019-02-03 DIAGNOSIS — J020 Streptococcal pharyngitis: Secondary | ICD-10-CM | POA: Insufficient documentation

## 2019-02-03 DIAGNOSIS — Z87891 Personal history of nicotine dependence: Secondary | ICD-10-CM | POA: Insufficient documentation

## 2019-02-03 DIAGNOSIS — Z79899 Other long term (current) drug therapy: Secondary | ICD-10-CM | POA: Insufficient documentation

## 2019-02-03 LAB — GROUP A STREP BY PCR: Group A Strep by PCR: DETECTED — AB

## 2019-02-03 MED ORDER — PENICILLIN G BENZATHINE 1200000 UNIT/2ML IM SUSP
1.2000 10*6.[IU] | Freq: Once | INTRAMUSCULAR | Status: AC
  Administered 2019-02-03: 1.2 10*6.[IU] via INTRAMUSCULAR
  Filled 2019-02-03: qty 2

## 2019-02-03 NOTE — ED Notes (Signed)
Pt enrolled in aromatherapy pain trial 

## 2019-02-03 NOTE — ED Triage Notes (Signed)
Sore throat x 2 days, daughter has strep

## 2019-02-03 NOTE — ED Provider Notes (Signed)
[facility] EMERGENCY DEPARTMENT Provider Note   CSN: [id number] Arrival date & time: 02/03/19  6468    History   Chief Complaint Chief Complaint  Patient presents with  . Sore Throat    HPI Nicole Mccoy is a 38 y.o. female.     Patient is a 38 year old female who presents with a sore throat.  She states she has had a sore throat for about a week with some runny nose and nasal congestion and mild coughing but no chest congestion.  She says she does not check her temperature at home so does not know if she had a fever.  She denies any vomiting or diarrhea.  No shortness of breath.  She states her daughter has had strep throat twice in the last month.     Past Medical History:  Diagnosis Date  . Fibromyalgia   . Migraine   . Pneumonia   . Raynaud's syndrome     Patient Active Problem List   Diagnosis Date Noted  . Rash 04/10/2012  . Fibromyalgia 04/10/2012  . Migraine 04/10/2012    Past Surgical History:  Procedure Laterality Date  . SHOULDER SURGERY       OB History   No obstetric history on file.      Home Medications    Prior to Admission medications   Medication Sig Start Date End Date Taking? Authorizing Provider  Citalopram Hydrobromide (CELEXA PO) Take by mouth.   Yes [provider]  clonazePAM (KLONOPIN) 1 MG tablet Take 1 mg by mouth 2 (two) times daily as needed.   Yes [provider]  cyclobenzaprine (FLEXERIL) 10 MG tablet Take 10 mg by mouth 3 (three) times daily as needed for muscle spasms.   Yes [provider]  Topiramate (TOPAMAX PO) Take by mouth.   Yes [provider]  traMADol (ULTRAM) 50 MG tablet Take 50 mg by mouth every 6 (six) hours as needed.   Yes [provider]  predniSONE (DELTASONE) 20 MG tablet 3 tabs po day one, then 2 tabs daily x 4 days 09/23/18   Fayrene Helper, PA-C  valACYclovir (VALTREX) 1000 MG tablet Take 1 tablet (1,000 mg total) by mouth 3 (three) times daily.  09/23/18   Fayrene Helper, PA-C    Family History No family history on file.  Social History Social History   Tobacco Use  . Smoking status: Former Smoker    Packs/day: 0.00  . Smokeless tobacco: Never Used  Substance Use Topics  . Alcohol use: Yes    Comment: rare  . Drug use: No     Allergies   Ibuprofen; Lorazepam; and Sulfa antibiotics   Review of Systems Review of Systems  Constitutional: Positive for fatigue. Negative for chills, diaphoresis and fever.  HENT: Positive for congestion, rhinorrhea and sore throat. Negative for sneezing, trouble swallowing and voice change.   Eyes: Negative.   Respiratory: Negative for cough, chest tightness and shortness of breath.   Cardiovascular: Negative for chest pain and leg swelling.  Gastrointestinal: Negative for abdominal pain, blood in stool, diarrhea, nausea and vomiting.  Genitourinary: Negative for difficulty urinating, flank pain, frequency and hematuria.  Musculoskeletal: Positive for myalgias. Negative for arthralgias and back pain.  Skin: Negative for rash.  Neurological: Negative for dizziness, speech difficulty, weakness, numbness and headaches.     Physical Exam Updated Vital Signs BP (!) 125/100 (BP Location: Right Arm)   Pulse (!) 125 Comment: Drinking a energy drink with triple shot  Temp 98.3 F (36.8 C) (Oral)   Resp 16   Ht 5\' 4"  (1.626 m)   Wt 60.8 kg   LMP 01/31/2019   SpO2 100%   BMI 23.00 kg/m   Physical Exam Constitutional:      Appearance: She is well-developed.  HENT:     Head: Normocephalic and atraumatic.     Right Ear: Tympanic membrane normal.     Left Ear: Tympanic membrane normal.     Nose: Rhinorrhea present.     Mouth/Throat:     Mouth: No oral lesions.     Pharynx: No pharyngeal swelling, oropharyngeal exudate, posterior oropharyngeal erythema or uvula swelling.     Comments: No erythema or exudates, uvula is midline, no trismus Eyes:     Pupils: Pupils are equal, round, and  reactive to light.  Neck:     Musculoskeletal: Normal range of motion and neck supple.  Cardiovascular:     Rate and Rhythm: Normal rate and regular rhythm.     Heart sounds: Normal heart sounds.  Pulmonary:     Effort: Pulmonary effort is normal. No respiratory distress.     Breath sounds: Normal breath sounds. No wheezing or rales.  Chest:     Chest wall: No tenderness.  Abdominal:     General: Bowel sounds are normal.     Palpations: Abdomen is soft.     Tenderness: There is no abdominal tenderness. There is no guarding or rebound.  Musculoskeletal: Normal range of motion.  Lymphadenopathy:     Cervical: No cervical adenopathy.  Skin:    General: Skin is warm and dry.     Findings: No rash.  Neurological:     Mental Status: She is alert and oriented to person, place, and time.      ED Treatments / Results  Labs (all labs ordered are listed, but only abnormal results are displayed) Labs Reviewed  GROUP A STREP BY PCR - Abnormal; Notable for the following components:      Result Value   Group A Strep by PCR DETECTED (*)    All other components within normal limits    EKG None  Radiology No results found.  Procedures Procedures (including critical care time)  Medications Ordered in ED Medications  penicillin g benzathine (BICILLIN LA) 1200000 UNIT/2ML injection 1.2 Million Units (has no administration in time range)     Initial Impression / Assessment and Plan / ED Course  I have reviewed the triage vital signs and the nursing notes.  Pertinent labs & imaging results that were available during my care of the patient were reviewed by me and considered in my medical decision making (see chart for details).       Patient strep test is positive.  She opted for Bicillin.  She has no clinical suggestions of a peritonsillar abscess.  She was discharged home in good condition.  Return precautions were given.  Final Clinical Impressions(s) / ED Diagnoses   Final  diagnoses:  Strep pharyngitis    ED Discharge Orders    None       Rolan Bucco, MD 02/03/19 1044

## 2024-03-30 ENCOUNTER — Emergency Department (HOSPITAL_BASED_OUTPATIENT_CLINIC_OR_DEPARTMENT_OTHER): Payer: Self-pay

## 2024-03-30 ENCOUNTER — Other Ambulatory Visit: Payer: Self-pay

## 2024-03-30 ENCOUNTER — Encounter (HOSPITAL_BASED_OUTPATIENT_CLINIC_OR_DEPARTMENT_OTHER): Payer: Self-pay

## 2024-03-30 ENCOUNTER — Inpatient Hospital Stay (HOSPITAL_BASED_OUTPATIENT_CLINIC_OR_DEPARTMENT_OTHER)
Admission: EM | Admit: 2024-03-30 | Discharge: 2024-04-01 | DRG: 641 | Disposition: A | Discharge status: Home / Self Care | Payer: MEDICAID | Attending: Internal Medicine | Admitting: Internal Medicine

## 2024-03-30 DIAGNOSIS — E872 Acidosis, unspecified: Secondary | ICD-10-CM | POA: Diagnosis present

## 2024-03-30 DIAGNOSIS — R748 Abnormal levels of other serum enzymes: Secondary | ICD-10-CM | POA: Diagnosis present

## 2024-03-30 DIAGNOSIS — M797 Fibromyalgia: Secondary | ICD-10-CM | POA: Diagnosis present

## 2024-03-30 DIAGNOSIS — Z7952 Long term (current) use of systemic steroids: Secondary | ICD-10-CM

## 2024-03-30 DIAGNOSIS — Z792 Long term (current) use of antibiotics: Secondary | ICD-10-CM

## 2024-03-30 DIAGNOSIS — Z79899 Other long term (current) drug therapy: Secondary | ICD-10-CM

## 2024-03-30 DIAGNOSIS — T730XXA Starvation, initial encounter: Secondary | ICD-10-CM | POA: Diagnosis present

## 2024-03-30 DIAGNOSIS — L03032 Cellulitis of left toe: Secondary | ICD-10-CM | POA: Diagnosis present

## 2024-03-30 DIAGNOSIS — G43909 Migraine, unspecified, not intractable, without status migrainosus: Secondary | ICD-10-CM | POA: Diagnosis present

## 2024-03-30 DIAGNOSIS — K529 Noninfective gastroenteritis and colitis, unspecified: Secondary | ICD-10-CM

## 2024-03-30 DIAGNOSIS — F419 Anxiety disorder, unspecified: Secondary | ICD-10-CM

## 2024-03-30 DIAGNOSIS — D219 Benign neoplasm of connective and other soft tissue, unspecified: Secondary | ICD-10-CM

## 2024-03-30 DIAGNOSIS — E860 Dehydration: Principal | ICD-10-CM | POA: Diagnosis present

## 2024-03-30 DIAGNOSIS — R112 Nausea with vomiting, unspecified: Principal | ICD-10-CM

## 2024-03-30 DIAGNOSIS — R000 Tachycardia, unspecified: Secondary | ICD-10-CM

## 2024-03-30 DIAGNOSIS — Z882 Allergy status to sulfonamides status: Secondary | ICD-10-CM

## 2024-03-30 DIAGNOSIS — Z888 Allergy status to other drugs, medicaments and biological substances status: Secondary | ICD-10-CM

## 2024-03-30 DIAGNOSIS — R1115 Cyclical vomiting syndrome unrelated to migraine: Secondary | ICD-10-CM | POA: Diagnosis present

## 2024-03-30 DIAGNOSIS — E874 Mixed disorder of acid-base balance: Secondary | ICD-10-CM | POA: Diagnosis present

## 2024-03-30 DIAGNOSIS — E8729 Other acidosis: Secondary | ICD-10-CM | POA: Diagnosis present

## 2024-03-30 DIAGNOSIS — F17210 Nicotine dependence, cigarettes, uncomplicated: Secondary | ICD-10-CM | POA: Diagnosis present

## 2024-03-30 DIAGNOSIS — D252 Subserosal leiomyoma of uterus: Secondary | ICD-10-CM | POA: Diagnosis present

## 2024-03-30 DIAGNOSIS — R7989 Other specified abnormal findings of blood chemistry: Secondary | ICD-10-CM

## 2024-03-30 LAB — BASIC METABOLIC PANEL WITH GFR
Anion gap: 12 (ref 5–15)
BUN: 10 mg/dL (ref 6–20)
CO2: 18 mmol/L — ABNORMAL LOW (ref 22–32)
Calcium: 7.9 mg/dL — ABNORMAL LOW (ref 8.9–10.3)
Chloride: 107 mmol/L (ref 98–111)
Creatinine, Ser: 0.45 mg/dL (ref 0.44–1.00)
GFR, Estimated: >60 mL/min (ref >60)
Glucose, Bld: 90 mg/dL (ref 70–99)
Potassium: 3.6 mmol/L (ref 3.5–5.1)
Sodium: 137 mmol/L (ref 135–145)

## 2024-03-30 LAB — COMPREHENSIVE METABOLIC PANEL WITH GFR
ALT: 104 U/L — ABNORMAL HIGH (ref 0–44)
ALT: 79 U/L — ABNORMAL HIGH (ref 0–44)
AST: 207 U/L — ABNORMAL HIGH (ref 15–41)
AST: 135 U/L — ABNORMAL HIGH (ref 15–41)
Albumin: 4.1 g/dL (ref 3.5–5.0)
Albumin: 3.6 g/dL (ref 3.5–5.0)
Alkaline Phosphatase: 164 U/L — ABNORMAL HIGH (ref 38–126)
Alkaline Phosphatase: 137 U/L — ABNORMAL HIGH (ref 38–126)
Anion gap: 25 — ABNORMAL HIGH (ref 5–15)
Anion gap: 17 — ABNORMAL HIGH (ref 5–15)
BUN: 13 mg/dL (ref 6–20)
BUN: 10 mg/dL (ref 6–20)
CO2: 11 mmol/L — ABNORMAL LOW (ref 22–32)
CO2: 13 mmol/L — ABNORMAL LOW (ref 22–32)
Calcium: 8.2 mg/dL — ABNORMAL LOW (ref 8.9–10.3)
Calcium: 7.2 mg/dL — ABNORMAL LOW (ref 8.9–10.3)
Chloride: 101 mmol/L (ref 98–111)
Chloride: 103 mmol/L (ref 98–111)
Creatinine, Ser: 0.69 mg/dL (ref 0.44–1.00)
Creatinine, Ser: 0.61 mg/dL (ref 0.44–1.00)
GFR, Estimated: >60 mL/min (ref >60)
GFR, Estimated: >60 mL/min (ref >60)
Glucose, Bld: 46 mg/dL — ABNORMAL LOW (ref 70–99)
Glucose, Bld: 231 mg/dL — ABNORMAL HIGH (ref 70–99)
Potassium: 4.2 mmol/L (ref 3.5–5.1)
Potassium: 4 mmol/L (ref 3.5–5.1)
Sodium: 137 mmol/L (ref 135–145)
Sodium: 133 mmol/L — ABNORMAL LOW (ref 135–145)
Total Bilirubin: <0.2 mg/dL (ref 0.0–1.2)
Total Bilirubin: <0.2 mg/dL (ref 0.0–1.2)
Total Protein: 6.7 g/dL (ref 6.5–8.1)
Total Protein: 5.7 g/dL — ABNORMAL LOW (ref 6.5–8.1)

## 2024-03-30 LAB — HIV ANTIBODY (ROUTINE TESTING W REFLEX): HIV Screen 4th Generation wRfx: NONREACTIVE

## 2024-03-30 LAB — I-STAT VENOUS BLOOD GAS, ED
Acid-base deficit: 15 mmol/L — ABNORMAL HIGH (ref 0.0–2.0)
Bicarbonate: 12 mmol/L — ABNORMAL LOW (ref 20.0–28.0)
Calcium, Ion: 1.01 mmol/L — ABNORMAL LOW (ref 1.15–1.40)
HCT: 37 % (ref 36.0–46.0)
Hemoglobin: 12.6 g/dL (ref 12.0–15.0)
O2 Saturation: 66 %
Patient temperature: 37
Potassium: 4.4 mmol/L (ref 3.5–5.1)
Sodium: 134 mmol/L — ABNORMAL LOW (ref 135–145)
TCO2: 13 mmol/L — ABNORMAL LOW (ref 22–32)
pCO2, Ven: 31.1 mmHg — ABNORMAL LOW (ref 44–60)
pH, Ven: 7.195 — CL (ref 7.25–7.43)
pO2, Ven: 42 mmHg (ref 32–45)

## 2024-03-30 LAB — URINALYSIS, ROUTINE W REFLEX MICROSCOPIC
Bilirubin Urine: NEGATIVE
Glucose, UA: NEGATIVE mg/dL
Ketones, ur: 15 mg/dL — AB
Nitrite: NEGATIVE
Protein, ur: 30 mg/dL — AB
Specific Gravity, Urine: >=1.03 (ref 1.005–1.030)
pH: 5.5 (ref 5.0–8.0)

## 2024-03-30 LAB — C DIFFICILE QUICK SCREEN W PCR REFLEX
C Diff antigen: NEGATIVE
C Diff interpretation: NOT DETECTED
C Diff toxin: NEGATIVE

## 2024-03-30 LAB — CBC
HCT: 39.6 % (ref 36.0–46.0)
Hemoglobin: 13.1 g/dL (ref 12.0–15.0)
MCH: 35.1 pg — ABNORMAL HIGH (ref 26.0–34.0)
MCHC: 33.1 g/dL (ref 30.0–36.0)
MCV: 106.2 fL — ABNORMAL HIGH (ref 80.0–100.0)
Platelets: 272 10*3/uL (ref 150–400)
RBC: 3.73 MIL/uL — ABNORMAL LOW (ref 3.87–5.11)
RDW: 14 % (ref 11.5–15.5)
WBC: 8.6 10*3/uL (ref 4.0–10.5)
nRBC: 0 % (ref 0.0–0.2)

## 2024-03-30 LAB — BETA-HYDROXYBUTYRIC ACID: Beta-Hydroxybutyric Acid: 3.47 mmol/L — ABNORMAL HIGH (ref 0.05–0.27)

## 2024-03-30 LAB — GLUCOSE, CAPILLARY
Glucose-Capillary: 107 mg/dL — ABNORMAL HIGH (ref 70–99)
Glucose-Capillary: 61 mg/dL — ABNORMAL LOW (ref 70–99)
Glucose-Capillary: 89 mg/dL (ref 70–99)

## 2024-03-30 LAB — PREGNANCY, URINE: Preg Test, Ur: NEGATIVE

## 2024-03-30 LAB — URINALYSIS, MICROSCOPIC (REFLEX)

## 2024-03-30 LAB — MAGNESIUM: Magnesium: 1.9 mg/dL (ref 1.7–2.4)

## 2024-03-30 LAB — CBG MONITORING, ED
Glucose-Capillary: 46 mg/dL — ABNORMAL LOW (ref 70–99)
Glucose-Capillary: 281 mg/dL — ABNORMAL HIGH (ref 70–99)

## 2024-03-30 LAB — LACTIC ACID, PLASMA
Lactic Acid, Venous: 1.6 mmol/L (ref 0.5–1.9)
Lactic Acid, Venous: 1.5 mmol/L (ref 0.5–1.9)

## 2024-03-30 LAB — HEPATITIS PANEL, ACUTE
HCV Ab: NONREACTIVE
Hep A IgM: NONREACTIVE
Hep B C IgM: NONREACTIVE
Hepatitis B Surface Ag: NONREACTIVE

## 2024-03-30 LAB — LIPASE, BLOOD: Lipase: 16 U/L (ref 11–51)

## 2024-03-30 MED ORDER — GABAPENTIN 300 MG PO CAPS
600.0000 mg | ORAL_CAPSULE | Freq: Two times a day (BID) | ORAL | Status: DC
  Administered 2024-03-30 – 2024-04-01 (×4): 600 mg via ORAL
  Filled 2024-03-30 (×4): qty 2

## 2024-03-30 MED ORDER — ACETAMINOPHEN 650 MG RE SUPP: 650.0000 mg | Freq: Four times a day (QID) | RECTAL | Status: DC | PRN

## 2024-03-30 MED ORDER — SODIUM CHLORIDE 0.9 % IV SOLN: INTRAVENOUS | Status: DC

## 2024-03-30 MED ORDER — TOPIRAMATE 100 MG PO TABS
200.0000 mg | ORAL_TABLET | Freq: Every day | ORAL | Status: DC
  Administered 2024-03-30 – 2024-03-31 (×2): 200 mg via ORAL
  Filled 2024-03-30 (×2): qty 2

## 2024-03-30 MED ORDER — SODIUM CHLORIDE 0.9% FLUSH
3.0000 mL | Freq: Two times a day (BID) | INTRAVENOUS | Status: DC
  Administered 2024-03-30 – 2024-04-01 (×4): 3 mL via INTRAVENOUS

## 2024-03-30 MED ORDER — SODIUM CHLORIDE 0.9 % IV BOLUS
1000.0000 mL | Freq: Once | INTRAVENOUS | Status: AC
  Administered 2024-03-30: 1000 mL via INTRAVENOUS

## 2024-03-30 MED ORDER — CYCLOBENZAPRINE HCL 10 MG PO TABS: 10.0000 mg | ORAL_TABLET | Freq: Three times a day (TID) | ORAL | Status: DC | PRN

## 2024-03-30 MED ORDER — DEXTROSE 10 % IV SOLN: INTRAVENOUS | Status: DC

## 2024-03-30 MED ORDER — ONDANSETRON HCL 4 MG/2ML IJ SOLN
4.0000 mg | Freq: Four times a day (QID) | INTRAMUSCULAR | Status: DC | PRN
  Filled 2024-03-30: qty 2

## 2024-03-30 MED ORDER — ACETAMINOPHEN 325 MG PO TABS
650.0000 mg | ORAL_TABLET | Freq: Four times a day (QID) | ORAL | Status: DC | PRN
  Administered 2024-03-30 – 2024-03-31 (×2): 650 mg via ORAL
  Filled 2024-03-30 (×2): qty 2

## 2024-03-30 MED ORDER — CLONAZEPAM 1 MG PO TABS: 1.0000 mg | ORAL_TABLET | Freq: Two times a day (BID) | ORAL | Status: DC | PRN

## 2024-03-30 MED ORDER — POLYETHYLENE GLYCOL 3350 17 G PO PACK: 17.0000 g | PACK | Freq: Every day | ORAL | Status: DC | PRN

## 2024-03-30 MED ORDER — ENOXAPARIN SODIUM 40 MG/0.4ML IJ SOSY
40.0000 mg | PREFILLED_SYRINGE | INTRAMUSCULAR | Status: DC
  Administered 2024-03-31: 40 mg via SUBCUTANEOUS
  Filled 2024-03-30 (×2): qty 0.4

## 2024-03-30 MED ORDER — PROMETHAZINE (PHENERGAN) 6.25MG IN NS 50ML IVPB
6.2500 mg | Freq: Four times a day (QID) | INTRAVENOUS | Status: DC | PRN
  Administered 2024-03-30 – 2024-04-01 (×4): 6.25 mg via INTRAVENOUS
  Filled 2024-03-30 (×4): qty 6.25

## 2024-03-30 MED ORDER — TRAMADOL HCL 50 MG PO TABS
50.0000 mg | ORAL_TABLET | Freq: Four times a day (QID) | ORAL | Status: DC | PRN
  Administered 2024-03-30: 50 mg via ORAL
  Filled 2024-03-30 (×2): qty 1

## 2024-03-30 MED ORDER — DEXTROSE IN LACTATED RINGERS 5 % IV SOLN: INTRAVENOUS | Status: AC

## 2024-03-30 MED ORDER — LOPERAMIDE HCL 2 MG PO CAPS
4.0000 mg | ORAL_CAPSULE | Freq: Once | ORAL | Status: AC
  Administered 2024-03-30: 4 mg via ORAL
  Filled 2024-03-30: qty 2

## 2024-03-30 MED ORDER — CLONAZEPAM 0.5 MG PO TABS
0.5000 mg | ORAL_TABLET | Freq: Two times a day (BID) | ORAL | Status: DC | PRN
  Administered 2024-04-01: 0.5 mg via ORAL
  Filled 2024-03-30: qty 1

## 2024-03-30 MED ORDER — IOHEXOL 300 MG/ML  SOLN
100.0000 mL | Freq: Once | INTRAMUSCULAR | Status: AC | PRN
  Administered 2024-03-30: 100 mL via INTRAVENOUS

## 2024-03-30 MED ORDER — TRAMADOL HCL 50 MG PO TABS
50.0000 mg | ORAL_TABLET | Freq: Once | ORAL | Status: AC | PRN
  Administered 2024-03-30: 50 mg via ORAL
  Filled 2024-03-30: qty 1

## 2024-03-30 MED ORDER — CITALOPRAM HYDROBROMIDE 20 MG PO TABS
40.0000 mg | ORAL_TABLET | Freq: Every day | ORAL | Status: DC
  Administered 2024-03-30 – 2024-03-31 (×2): 40 mg via ORAL
  Filled 2024-03-30 (×2): qty 2

## 2024-03-30 MED ORDER — ONDANSETRON HCL 4 MG/2ML IJ SOLN
4.0000 mg | Freq: Once | INTRAMUSCULAR | Status: AC
  Administered 2024-03-30: 4 mg via INTRAVENOUS
  Filled 2024-03-30: qty 2

## 2024-03-30 NOTE — ED Notes (Signed)
 Called [facility] for Transport to [facility] @15 :37

## 2024-03-30 NOTE — Assessment & Plan Note (Signed)
 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 Hard to coroborate from medical record. Keep on telemetry tonight given marked inflmation enteric and electrolyte abnormalitis. Will hceck TSH Free T4

## 2024-03-30 NOTE — ED Provider Notes (Signed)
 [place] [facility] Provider Note   CSN: [id number] Arrival date & time: 03/30/24  4098     History  Chief Complaint  Patient presents with   Emesis   Diarrhea    Nicole Mccoy is a 43 y.o. female.   Emesis Associated symptoms: diarrhea   Diarrhea Associated symptoms: vomiting     43 year old female presents emergency department with complaints of nausea, vomiting, diarrhea.  Reports symptoms beginning on Thursday.  States that she was working at home for the past 2 years or so and recently went back in the office.  States that her daughter has also had multiple friends/children in the home with potential sick exposures.  Patient reports having nausea as well as emesis Thursday and Friday which is since resolved.  Reports persistent diarrhea.  Reports 10-12+ episodes of loose bowel movements per day nonbloody or nonmelanotic in appearance.  Does report some abdominal pain associated with bouts of emesis when that occurred but currently without any abdominal pain.  Denies any recent antibiotic use, international travel, hiking, drinking from bodies of water, GLP-1 use.  Has tried Pepto-Bismol with minimal relief of symptoms.  Presents emergency department for further assessment/evaluation.  Past medical history significant for migraine, fibromyalgia, Raynaud's syndrome  Home Medications Prior to Admission medications   Medication Sig Start Date End Date Taking? Authorizing Provider  Citalopram Hydrobromide (CELEXA PO) Take by mouth.    [provider]  clonazePAM (KLONOPIN) 1 MG tablet Take 1 mg by mouth 2 (two) times daily as needed.    [provider]  cyclobenzaprine  (FLEXERIL ) 10 MG tablet Take 10 mg by mouth 3 (three) times daily as needed for muscle spasms.    [provider]  predniSONE  (DELTASONE ) 20 MG tablet 3 tabs po day one, then 2 tabs daily x 4 days 09/23/18   Debbra Fairy, PA-C  Topiramate (TOPAMAX PO)  Take by mouth.    [provider]  traMADol (ULTRAM) 50 MG tablet Take 50 mg by mouth every 6 (six) hours as needed.    [provider]  valACYclovir  (VALTREX ) 1000 MG tablet Take 1 tablet (1,000 mg total) by mouth 3 (three) times daily. 09/23/18   Debbra Fairy, PA-C      Allergies    Ibuprofen , Lorazepam , and Sulfa antibiotics    Review of Systems   Review of Systems  Gastrointestinal:  Positive for diarrhea and vomiting.  All other systems reviewed and are negative.   Physical Exam Updated Vital Signs BP (!) 113/98 (BP Location: Left Arm)   Pulse (!) 110   Temp 97.6 F (36.4 C) (Oral)   Resp 20   Ht 5\' 4"  (1.626 m)   Wt 68 kg   SpO2 100%   BMI 25.75 kg/m  Physical Exam Vitals and nursing note reviewed.  Constitutional:      General: She is not in acute distress.    Appearance: She is well-developed.  HENT:     Head: Normocephalic and atraumatic.  Eyes:     Conjunctiva/sclera: Conjunctivae normal.  Cardiovascular:     Rate and Rhythm: Regular rhythm. Tachycardia present.     Heart sounds: No murmur heard. Pulmonary:     Effort: Pulmonary effort is normal. No respiratory distress.     Breath sounds: Normal breath sounds. No wheezing, rhonchi or rales.  Abdominal:     Palpations: Abdomen is soft.     Tenderness: There is no abdominal tenderness.  Musculoskeletal:  General: No swelling.     Cervical back: Neck supple.  Skin:    General: Skin is warm and dry.     Capillary Refill: Capillary refill takes less than 2 seconds.  Neurological:     Mental Status: She is alert.  Psychiatric:        Mood and Affect: Mood normal.     ED Results / Procedures / Treatments   Labs (all labs ordered are listed, but only abnormal results are displayed) Labs Reviewed  CBC - Abnormal; Notable for the following components:      Result Value   RBC 3.73 (*)    MCV 106.2 (*)    MCH 35.1 (*)    All other components within normal limits  URINALYSIS,  ROUTINE W REFLEX MICROSCOPIC - Abnormal; Notable for the following components:   APPearance HAZY (*)    Hgb urine dipstick MODERATE (*)    Ketones, ur 15 (*)    Protein, ur 30 (*)    Leukocytes,Ua TRACE (*)    All other components within normal limits  URINALYSIS, MICROSCOPIC (REFLEX) - Abnormal; Notable for the following components:   Bacteria, UA MANY (*)    All other components within normal limits  PREGNANCY, URINE  LIPASE, BLOOD  COMPREHENSIVE METABOLIC PANEL WITH GFR  MAGNESIUM    EKG None  Radiology No results found.  Procedures .Critical Care  Performed by: Dupont Butter, PA Authorized by: Solano Butter, PA   Critical care provider statement:    Critical care time (minutes):  61   Critical care was necessary to treat or prevent imminent or life-threatening deterioration of the following conditions:  Dehydration and metabolic crisis   Critical care was time spent personally by me on the following activities:  Development of treatment plan with patient or surrogate, discussions with consultants, evaluation of patient's response to treatment, examination of patient, ordering and review of laboratory studies, ordering and review of radiographic studies, ordering and performing treatments and interventions, pulse oximetry, re-evaluation of patient's condition and review of old charts   I assumed direction of critical care for this patient from another provider in my specialty: no     Care discussed with: admitting provider       Medications Ordered in ED Medications  sodium chloride  0.9 % bolus 1,000 mL (1,000 mLs Intravenous New Bag/Given 03/30/24 0941)  loperamide (IMODIUM) capsule 4 mg (4 mg Oral Given 03/30/24 0939)    ED Course/ Medical Decision Making/ A&P Clinical Course as of 03/30/24 1526  Tue Mar 30, 2024  1021 Glucose(!): 46 Patient tolerating orally so we will try to increase blood sugar via liquid/foods while receiving IV hydration. [CR]  1130  Patient's repeat CBG 46.  Consulted attending physician Dr. Gordon Latus who recommended beginning D10 given no improvement of patient's blood sugar with oral intake [CR]  1319 This is a 43 year old female presenting to ED with complaint of vomiting and diarrhea ongoing for 5 days.  Patient reports he began with persistent vomiting which has since resolved but she has had loose watery diarrhea for the past 5 days.  She says she has been able to drink fluids but has not eaten anything in 5 days because food makes her extremely nauseated.  She denies any history of diabetes.  Denies travel outside the country or recent antibiotics.  On exam patient was afebrile, tachycardic, blood pressure normal.  She has some generalized abdominal discomfort.  Labs are notable for a mixed metabolic and respiratory acidosis  with pH 7.19, bicarb 12.  Patient had a CMP which showed anion gap of 25, mild transaminitis.  UA with protein and ketones.  Initial blood sugar was hypoglycemic in the 40s, but then after drinking ginger ale and starting D10, patient's blood sugar elevated to over 200.  I suspect hypoglycemia is likely related to no food intake for the past 5 days.  I suspect her labs are most consistent with a starvation ketoacidosis, less likely new onset DKA.  She needs aggressive fluids and recheck of her blood test.  Transaminitis is unclear etiology but the patient does not have abdominal discomfort or Murphy sign to suggest gallbladder etiology.  CT imaging of the abdomen would not show any emergent findings.  She may need stool studies given the quantity of liquid stool that she has been having.  No acute C. difficile risk factors.  Plan to admit to the hospital. [MT]  1420 Consulted hospitalist Dr. Versa Gore who agreed with admission and assume further treatment/care. [CR]  1437 Repeat cmp showing some improvement of lft's and anion gap now 17.  Stopped d10 infusion in favor of regular saline [MT]    Clinical Course  User Index [CR] Keyes Butter, PA [MT] Gordon Latus Janalyn Me, MD                                 Medical Decision Making Amount and/or Complexity of Data Reviewed Labs: ordered. Decision-making details documented in ED Course. Radiology: ordered.  Risk Prescription drug management. Decision regarding hospitalization.   This patient presents to the ED for concern of nausea, emesis, this involves an extensive number of treatment options, and is a complaint that carries with it a high risk of complications and morbidity.  The differential diagnosis includes gastritis, hyperemesis cannabinoid syndrome, CBD pathology, gastritis, gastroparesis, SBO/LBO, volvulus, diverticulitis, appendicitis, viral gastroenteritis, foodborne illness, other   Co morbidities that complicate the patient evaluation  See HPI   Additional history obtained:  Additional history obtained from EMR External records from outside source obtained and reviewed including hospital records   Lab Tests:  I Ordered, and personally interpreted labs.  The pertinent results include: No leukocytosis.  No evidence of anemia.  Platelets within range.  Urine pregnancy negative.  UA with many bacteria, 11-20 RBCs, 15 ketones, 30 proteins, trace leukocytes, moderate hemoglobin.  Initial metabolic panel with transaminitis, in addition alk phos, normal renal function with increased anion gap of 25.  VBG pH is 7.1, PCO2 of 31, bicarb of 12.  Lipase within normal limits.  Hepatitis panel, stool studies pending   Imaging Studies ordered:  I ordered CT imaging of the abdomen pelvis of which negative for any acute finding but with 3 cm uterine fibroid I independently evaluated CT imaging and was in agreement with radiologist interpretation   Cardiac Monitoring: / EKG:  The patient was maintained on a cardiac monitor.  I personally viewed and interpreted the cardiac monitored which showed an underlying rhythm of: Sinus  tachycardia   Consultations Obtained:  See ED course   Problem List / ED Course / Critical interventions / Medication management  Diarrhea, nausea, emesis I ordered medication including 1 L normal saline, Zofran    Reevaluation of the patient after these medicines showed that the patient improved I have reviewed the patients home medicines and have made adjustments as needed   Social Determinants of Health:  Cigarette use.  Denies illicit drug use.  Test / Admission - Considered:  Diarrhea, nausea, emesis Vitals signs significant for hypertension. Otherwise within normal range and stable throughout visit. Laboratory studies significant for: See above 43 year old female presents emergency department with complaints of nausea, vomiting, diarrhea.  Reports symptoms beginning on Thursday.  States that she was working at home for the past 2 years or so and recently went back in the office.  States that her daughter has also had multiple friends/children in the home with potential sick exposures.  Patient reports having nausea as well as emesis Thursday and Friday which is since resolved.  Reports persistent diarrhea.  Reports 10-12+ episodes of loose bowel movements per day nonbloody or nonmelanotic in appearance.  Does report some abdominal pain associated with bouts of emesis when that occurred but currently without any abdominal pain.  Denies any recent antibiotic use, international travel, hiking, drinking from bodies of water, GLP-1 use.  Has tried Pepto-Bismol with minimal relief of symptoms.  Presents emergency department for further assessment/evaluation. On exam, patient has tachycardia.  No real abdominal tenderness reproducible on exam but with reported generalized abdominal discomfort.  Labs concerning for significant dehydration.  Initial metabolic panel with a bicarb of 11, anion gap of 25 with normal renal function, transaminitis, elevation alk phos.  UA with ketones present.   VBG as above concerning for mixed respiratory/metabolic acidosis.  CT imaging negative for any acute abnormality.  Laboratory studies concerning for starvation ketosis and less likely DKA given patient's history.  Patient reported subjective improvement after blood sugar corrected with IV fluids administered.  Will admit to hospitalist given degree of patient's abnormal laboratory studies.         Final Clinical Impression(s) / ED Diagnoses Final diagnoses:  None    Rx / DC Orders ED Discharge Orders     None          Butter, [place] 03/30/24 1529    Arvilla Birmingham, MD 03/31/24 [phone]

## 2024-03-30 NOTE — Assessment & Plan Note (Signed)
 C/w tramadol, celexa, flexeril , gabapentin.

## 2024-03-30 NOTE — ED Triage Notes (Addendum)
 Patient arrives POV with complaints of ongoing nausea/vomiting & diarrhea x5 days. Patient states that she's had minimal PO intake over the past one 1 week as well.

## 2024-03-30 NOTE — Assessment & Plan Note (Addendum)
 Has been going on for approximately 4 days.  Patient reports not having any vomiting or diarrhea today.  Has been able to tolerate clears.  Patient has not received any antibiotics thus far, not febrile or having any white count.  I feel that this may be at the tail end of its course.  Patient is still pending C. difficile testing.  But at this time I will hold off on antibiotics in this immunocompetent lady.  Advance diet as able.

## 2024-03-30 NOTE — Assessment & Plan Note (Signed)
 Check acute hepatitis panel in AM. Trend.

## 2024-03-30 NOTE — H&P (Addendum)
 History and Physical    Patient: Nicole Mccoy ZOX:[id number] DOB: 05/02/1981 DOA: 03/30/2024 DOS: the patient was seen and examined on 03/30/2024 PCP: Zella Hidalgo, PA-C  Patient coming from: Home  Chief Complaint:  Chief Complaint  Patient presents with   Emesis   Diarrhea   HPI: Nicole Mccoy is a 43 y.o. female with medical history significant of medical issues as lsited below. Patient was in her usual state of health till about 4 days ago when she rport abrupt onset of diahrea and vomting between 2-5 episodes of each per day . No blooding. A.w. abd cramping. No fever.   Earleir today patinet reports being so fatigued, she could not get out of bed. She came to [facility] ER. However, has not had more emesis or diahrea since then.   CT abd -no acute finding   Liver patient did have electrolyte abnormalities and tachycardia, transferred to [facility] inpatient unit.  Patient has been able to tolerate some clears since then.  Review of Systems: As mentioned in the history of present illness. All other systems reviewed and are negative. Past Medical History:  Diagnosis Date   Fibromyalgia    Migraine    Pneumonia    Raynaud's syndrome    Past Surgical History:  Procedure Laterality Date   SHOULDER SURGERY     Social History:  reports that she has quit smoking. Her smoking use included cigarettes. She has never used smokeless tobacco. She reports current alcohol use. She reports that she does not use drugs.  Allergies  Allergen Reactions   Sulfa Antibiotics Anaphylaxis   Ibuprofen  Other (See Comments)   Lorazepam  Other (See Comments)    -Does not help with symptoms    History reviewed. No pertinent family history.  Prior to Admission medications   Medication Sig Start Date End Date Taking? Authorizing Provider  botulinum toxin Type A (BOTOX) 200 units injection Inject 200 Units into the muscle every 3 (three) months. 03/05/24  Yes [provider]   ciprofloxacin  (CIPRO ) 500 MG tablet Take 500 mg by mouth 2 (two) times daily. 03/29/24 04/05/24 Yes [provider]  Citalopram Hydrobromide (CELEXA PO) Take 40 mg by mouth at bedtime.   Yes [provider]  clonazePAM (KLONOPIN) 1 MG tablet Take 1 mg by mouth 2 (two) times daily as needed for anxiety.   Yes [provider]  cyclobenzaprine  (FLEXERIL ) 10 MG tablet Take 10 mg by mouth 3 (three) times daily as needed for muscle spasms.   Yes [provider]  diphenoxylate-atropine (LOMOTIL) 2.5-0.025 MG tablet Take 1 tablet by mouth 4 (four) times daily as needed for diarrhea or loose stools. 03/29/24 04/08/24 Yes [provider]  gabapentin (NEURONTIN) 300 MG capsule Take 600 mg by mouth 2 (two) times daily. 03/23/18  Yes [provider]  methylphenidate 27 MG PO CR tablet Take 27 mg by mouth daily. 03/11/24 04/10/24 Yes [provider]  Topiramate (TOPAMAX PO) Take 200 mg by mouth daily.   Yes [provider]  traMADol (ULTRAM) 50 MG tablet Take 50 mg by mouth every 6 (six) hours as needed for moderate pain (pain score 4-6).   Yes [provider]    Physical Exam: Vitals:   03/30/24 1432 03/30/24 1433 03/30/24 1605 03/30/24 1659  BP: 125/89   (!) 141/109  Pulse:   (!) 110 (!) 124  Resp:    18  Temp:    98.4 F (36.9 C)  TempSrc:  Oral  SpO2:  100%  100%  Weight:      Height:       General: Patient is alert and awake, gives a coherent account of her symptoms Respiratory exam: Bilateral intravesically Cardiovascular exam S1-S2 normal Abdomen all quadrants soft nontender Extremities warm without edema no focal deficit Data Reviewed:  Labs on Admission:  Results for orders placed or performed during the hospital encounter of 03/30/24 (from the past 24 hours)  Urinalysis, Routine w reflex microscopic -Urine, Clean Catch     Status: Abnormal   Collection Time: 03/30/24  9:09 AM  Result Value Ref Range   Color,  Urine YELLOW YELLOW   APPearance HAZY (A) CLEAR   Specific Gravity, Urine >=1.030 1.005 - 1.030   pH 5.5 5.0 - 8.0   Glucose, UA NEGATIVE NEGATIVE mg/dL   Hgb urine dipstick MODERATE (A) NEGATIVE   Bilirubin Urine NEGATIVE NEGATIVE   Ketones, ur 15 (A) NEGATIVE mg/dL   Protein, ur 30 (A) NEGATIVE mg/dL   Nitrite NEGATIVE NEGATIVE   Leukocytes,Ua TRACE (A) NEGATIVE  Urinalysis, Microscopic (reflex)     Status: Abnormal   Collection Time: 03/30/24  9:09 AM  Result Value Ref Range   RBC / HPF 11-20 0 - 5 RBC/hpf   WBC, UA 0-5 0 - 5 WBC/hpf   Bacteria, UA MANY (A) NONE SEEN   Squamous Epithelial / HPF 6-10 0 - 5 /HPF  Lipase, blood     Status: None   Collection Time: 03/30/24  9:12 AM  Result Value Ref Range   Lipase 16 11 - 51 U/L  Comprehensive metabolic panel     Status: Abnormal   Collection Time: 03/30/24  9:12 AM  Result Value Ref Range   Sodium 137 135 - 145 mmol/L   Potassium 4.2 3.5 - 5.1 mmol/L   Chloride 101 98 - 111 mmol/L   CO2 11 (L) 22 - 32 mmol/L   Glucose, Bld 46 (L) 70 - 99 mg/dL   BUN 13 6 - 20 mg/dL   Creatinine, Ser 1.32 0.44 - 1.00 mg/dL   Calcium 8.2 (L) 8.9 - 10.3 mg/dL   Total Protein 6.7 6.5 - 8.1 g/dL   Albumin 4.1 3.5 - 5.0 g/dL   AST 440 (H) 15 - 41 U/L   ALT 104 (H) 0 - 44 U/L   Alkaline Phosphatase 164 (H) 38 - 126 U/L   Total Bilirubin <0.2 0.0 - 1.2 mg/dL   GFR, Estimated >10 >27 mL/min   Anion gap 25 (H) 5 - 15  CBC     Status: Abnormal   Collection Time: 03/30/24  9:12 AM  Result Value Ref Range   WBC 8.6 4.0 - 10.5 K/uL   RBC 3.73 (L) 3.87 - 5.11 MIL/uL   Hemoglobin 13.1 12.0 - 15.0 g/dL   HCT 25.3 66.4 - 40.3 %   MCV 106.2 (H) 80.0 - 100.0 fL   MCH 35.1 (H) 26.0 - 34.0 pg   MCHC 33.1 30.0 - 36.0 g/dL   RDW 47.4 25.9 - 56.3 %   Platelets 272 150 - 400 K/uL   nRBC 0.0 0.0 - 0.2 %  Pregnancy, urine     Status: None   Collection Time: 03/30/24  9:12 AM  Result Value Ref Range   Preg Test, Ur NEGATIVE NEGATIVE  Magnesium      Status: None   Collection Time: 03/30/24  9:34 AM  Result Value Ref Range   Magnesium 1.9 1.7 - 2.4 mg/dL  POC CBG, ED     Status: Abnormal   Collection Time: 03/30/24 11:17 AM  Result Value Ref Range   Glucose-Capillary 46 (L) 70 - 99 mg/dL  Hepatitis panel, acute     Status: None   Collection Time: 03/30/24 11:28 AM  Result Value Ref Range   Hepatitis B Surface Ag NON REACTIVE NON REACTIVE   HCV Ab NON REACTIVE NON REACTIVE   Hep A IgM NON REACTIVE NON REACTIVE   Hep B C IgM NON REACTIVE NON REACTIVE  I-Stat venous blood gas, ([facility] ED, [facility], [facility])     Status: Abnormal   Collection Time: 03/30/24 12:07 PM  Result Value Ref Range   pH, Ven 7.195 (LL) 7.25 - 7.43   pCO2, Ven 31.1 (L) 44 - 60 mmHg   pO2, Ven 42 32 - 45 mmHg   Bicarbonate 12.0 (L) 20.0 - 28.0 mmol/L   TCO2 13 (L) 22 - 32 mmol/L   O2 Saturation 66 %   Acid-base deficit 15.0 (H) 0.0 - 2.0 mmol/L   Sodium 134 (L) 135 - 145 mmol/L   Potassium 4.4 3.5 - 5.1 mmol/L   Calcium, Ion 1.01 (L) 1.15 - 1.40 mmol/L   HCT 37.0 36.0 - 46.0 %   Hemoglobin 12.6 12.0 - 15.0 g/dL   Patient temperature 46.9 C    Collection site IV start    Drawn by Nurse    Sample type VENOUS   POC CBG, ED     Status: Abnormal   Collection Time: 03/30/24 12:42 PM  Result Value Ref Range   Glucose-Capillary 281 (H) 70 - 99 mg/dL  Comprehensive metabolic panel     Status: Abnormal   Collection Time: 03/30/24  1:06 PM  Result Value Ref Range   Sodium 133 (L) 135 - 145 mmol/L   Potassium 4.0 3.5 - 5.1 mmol/L   Chloride 103 98 - 111 mmol/L   CO2 13 (L) 22 - 32 mmol/L   Glucose, Bld 231 (H) 70 - 99 mg/dL   BUN 10 6 - 20 mg/dL   Creatinine, Ser 6.29 0.44 - 1.00 mg/dL   Calcium 7.2 (L) 8.9 - 10.3 mg/dL   Total Protein 5.7 (L) 6.5 - 8.1 g/dL   Albumin 3.6 3.5 - 5.0 g/dL   AST 528 (H) 15 - 41 U/L   ALT 79 (H) 0 - 44 U/L   Alkaline Phosphatase 137 (H) 38 - 126 U/L   Total Bilirubin <0.2 0.0 - 1.2 mg/dL   GFR, Estimated >41 >32 mL/min   Anion gap 17  (H) 5 - 15  Lactic acid, plasma     Status: None   Collection Time: 03/30/24  6:13 PM  Result Value Ref Range   Lactic Acid, Venous 1.6 0.5 - 1.9 mmol/L  Beta-hydroxybutyric acid     Status: Abnormal   Collection Time: 03/30/24  6:13 PM  Result Value Ref Range   Beta-Hydroxybutyric Acid 3.47 (H) 0.05 - 0.27 mmol/L  Basic metabolic panel     Status: Abnormal   Collection Time: 03/30/24  6:13 PM  Result Value Ref Range   Sodium 137 135 - 145 mmol/L   Potassium 3.6 3.5 - 5.1 mmol/L   Chloride 107 98 - 111 mmol/L   CO2 18 (L) 22 - 32 mmol/L   Glucose, Bld 90 70 - 99 mg/dL   BUN 10 6 - 20 mg/dL   Creatinine, Ser 4.40 0.44 - 1.00 mg/dL   Calcium 7.9 (L) 8.9 - 10.3 mg/dL  GFR, Estimated >60 >60 mL/min   Anion gap 12 5 - 15  Glucose, capillary     Status: Abnormal   Collection Time: 03/30/24  6:36 PM  Result Value Ref Range   Glucose-Capillary 61 (L) 70 - 99 mg/dL  Glucose, capillary     Status: Abnormal   Collection Time: 03/30/24  7:25 PM  Result Value Ref Range   Glucose-Capillary 107 (H) 70 - 99 mg/dL   Basic Metabolic Panel: Recent Labs  Lab 03/30/24 0912 03/30/24 0934 03/30/24 1207 03/30/24 1306 03/30/24 1813  NA 137  --  134* 133* 137  K 4.2  --  4.4 4.0 3.6  CL 101  --   --  103 107  CO2 11*  --   --  13* 18*  GLUCOSE 46*  --   --  231* 90  BUN 13  --   --  10 10  CREATININE 0.69  --   --  0.61 0.45  CALCIUM 8.2*  --   --  7.2* 7.9*  MG  --  1.9  --   --   --    Liver Function Tests: Recent Labs  Lab 03/30/24 0912 03/30/24 1306  AST 207* 135*  ALT 104* 79*  ALKPHOS 164* 137*  BILITOT <0.2 <0.2  PROT 6.7 5.7*  ALBUMIN 4.1 3.6   Recent Labs  Lab 03/30/24 0912  LIPASE 16   No results for input(s): "AMMONIA" in the last 168 hours. CBC: Recent Labs  Lab 03/30/24 0912 03/30/24 1207  WBC 8.6  --   HGB 13.1 12.6  HCT 39.6 37.0  MCV 106.2*  --   PLT 272  --    Cardiac Enzymes: No results for input(s): "CKTOTAL", "CKMB", "CKMBINDEX", "TROPONINIHS"  in the last 168 hours.  BNP (last 3 results) No results for input(s): "PROBNP" in the last 8760 hours. CBG: Recent Labs  Lab 03/30/24 1117 03/30/24 1242 03/30/24 1836 03/30/24 1925  GLUCAP 46* 281* 61* 107*    Radiological Exams on Admission:  CT ABDOMEN PELVIS W CONTRAST Result Date: 03/30/2024 CLINICAL DATA:  Abdominal pain, nausea and vomiting, and diarrhea for 5 days. EXAM: CT ABDOMEN AND PELVIS WITH CONTRAST TECHNIQUE: Multidetector CT imaging of the abdomen and pelvis was performed using the standard protocol following bolus administration of intravenous contrast. RADIATION DOSE REDUCTION: This exam was performed according to the departmental dose-optimization program which includes automated exposure control, adjustment of the mA and/or kV according to patient size and/or use of iterative reconstruction technique. CONTRAST:  100mL OMNIPAQUE  IOHEXOL  300 MG/ML  SOLN COMPARISON:  None Available. FINDINGS: Lower Chest: No acute findings. Hepatobiliary: No suspicious hepatic masses identified. Gallbladder is unremarkable. No evidence of biliary ductal dilatation. Pancreas:  No mass or inflammatory changes. Spleen: Within normal limits in size and appearance. Adrenals/Urinary Tract: No suspicious masses identified. No evidence of ureteral calculi or hydronephrosis. Unremarkable unopacified urinary bladder. Stomach/Bowel: No evidence of obstruction, inflammatory process or abnormal fluid collections. Normal appendix visualized. Vascular/Lymphatic: No pathologically enlarged lymph nodes. No acute vascular findings. Reproductive: 3 cm subserosal fibroid noted in the left lateral uterine corpus. Adnexal regions are unremarkable. Other:  None. Musculoskeletal:  No suspicious bone lesions identified. IMPRESSION: No acute findings. 3 cm uterine fibroid. Electronically Signed   By: Marlyce Sine M.D.   On: 03/30/2024 11:50    chest X-ray  EKG: Independently reviewed. Sinus tachy.  I/O last 3  completed shifts: In: 2264.1 [I.V.:264.1; IV Piggyback:2000] Out: -  No intake/output data recorded.  HCG negative in urine.   Assessment and Plan: * High anion gap metabolic acidosis POA . Resolved. Lactic acid wnl. Likely from starvation ketosis. C.w. d5 lr infusion. Patient blood glucose pattenr is conssitent with prolonged fasting/starvation. She did not have hypoglycemic symptoms reported. No further testing is needed.  Gastroenteritis Has been going on for approximately 4 days.  Patient reports not having any vomiting or diarrhea today.  Has been able to tolerate clears.  Patient has not received any antibiotics thus far, not febrile or having any white count.  I feel that this may be at the tail end of its course.  Patient is still pending C. difficile testing.  But at this time I will hold off on antibiotics in this immunocompetent lady.  Advance diet as able.  Anxiety Chrnic. C.w. clonazepam (PDMP reviewed) and celexa  Tachycardia 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 Hard to coroborate from medical record. Keep on telemetry tonight given marked inflmation enteric and electrolyte abnormalitis. Will hceck TSH Free T4  Elevated LFTs Check acute hepatitis panel in AM. Trend.  Fibroid Incidental. Discussed with patient. She will seek outpatient follow up with PCP/OBGYN. Reports no abnormal bleeding.  Migraine C/w topamax.  Fibromyalgia C/w tramadol, celexa, flexeril , gabapentin.   DVT ppx - lovenox.    Advance Care Planning:   Code Status: Full Code   Consults: none  Family Communication: per patient.  Severity of Illness: The appropriate patient status for this patient is INPATIENT. Inpatient status is judged to be reasonable and necessary in order to provide the required intensity of service to ensure the patient's safety. The patient's presenting symptoms, physical exam findings, and initial radiographic and laboratory data in the  context of their chronic comorbidities is felt to place them at high risk for further clinical deterioration. Furthermore, it is not anticipated that the patient will be medically stable for discharge from the hospital within 2 midnights of admission.   * I certify that at the point of admission it is my clinical judgment that the patient will require inpatient hospital care spanning beyond 2 midnights from the point of admission due to high intensity of service, high risk for further deterioration and high frequency of surveillance required.*  Author: Bennie Brave, MD 03/30/2024 8:00 PM  For on call review [email or web address].

## 2024-03-30 NOTE — Assessment & Plan Note (Addendum)
 POA . Resolved. Lactic acid wnl. Likely from starvation ketosis. C.w. d5 lr infusion. Patient blood glucose pattenr is conssitent with prolonged fasting/starvation. She did not have hypoglycemic symptoms reported. No further testing is needed.

## 2024-03-30 NOTE — Assessment & Plan Note (Signed)
 C/w topamax

## 2024-03-30 NOTE — Progress Notes (Signed)
   03/30/24 1844  Provider Notification  Provider Name/Title Bennie Brave MD  Date Provider Notified 03/30/24  Time Provider Notified 1844  Method of Notification Page  Notification Reason Other (Comment) (CBG 61- IVF started per order and pt refused IV dextrose  per hypoglcemia orders. Pt on clears diet gave apple juice and icee will recheck CBG in 15 mins)  Provider response See new orders  Date of Provider Response 03/30/24  Time of Provider Response [phone]

## 2024-03-30 NOTE — Assessment & Plan Note (Signed)
 Chrnic. C.w. clonazepam (PDMP reviewed) and celexa

## 2024-03-30 NOTE — Assessment & Plan Note (Signed)
 Incidental. Discussed with patient. She will seek outpatient follow up with PCP/OBGYN. Reports no abnormal bleeding.

## 2024-03-31 LAB — GASTROINTESTINAL PANEL BY PCR, STOOL (REPLACES STOOL CULTURE)

## 2024-03-31 LAB — BASIC METABOLIC PANEL WITH GFR
Anion gap: 12 (ref 5–15)
BUN: 6 mg/dL (ref 6–20)
CO2: 18 mmol/L — ABNORMAL LOW (ref 22–32)
Calcium: 8.2 mg/dL — ABNORMAL LOW (ref 8.9–10.3)
Chloride: 107 mmol/L (ref 98–111)
Creatinine, Ser: 0.46 mg/dL (ref 0.44–1.00)
GFR, Estimated: >60 mL/min (ref >60)
Glucose, Bld: 84 mg/dL (ref 70–99)
Potassium: 3.2 mmol/L — ABNORMAL LOW (ref 3.5–5.1)
Sodium: 137 mmol/L (ref 135–145)

## 2024-03-31 LAB — HEPATITIS PANEL, ACUTE
HCV Ab: NONREACTIVE
Hep A IgM: NONREACTIVE
Hep B C IgM: NONREACTIVE
Hepatitis B Surface Ag: NONREACTIVE

## 2024-03-31 LAB — TSH: TSH: 6.481 u[IU]/mL — ABNORMAL HIGH (ref 0.350–4.500)

## 2024-03-31 LAB — CBC
HCT: 36.6 % (ref 36.0–46.0)
Hemoglobin: 12.1 g/dL (ref 12.0–15.0)
MCH: 34.9 pg — ABNORMAL HIGH (ref 26.0–34.0)
MCHC: 33.1 g/dL (ref 30.0–36.0)
MCV: 105.5 fL — ABNORMAL HIGH (ref 80.0–100.0)
Platelets: 243 10*3/uL (ref 150–400)
RBC: 3.47 MIL/uL — ABNORMAL LOW (ref 3.87–5.11)
RDW: 14.3 % (ref 11.5–15.5)
WBC: 3.9 10*3/uL — ABNORMAL LOW (ref 4.0–10.5)
nRBC: 0 % (ref 0.0–0.2)

## 2024-03-31 LAB — T4, FREE: Free T4: 0.76 ng/dL (ref 0.61–1.12)

## 2024-03-31 LAB — HEPATIC FUNCTION PANEL
ALT: 57 U/L — ABNORMAL HIGH (ref 0–44)
AST: 76 U/L — ABNORMAL HIGH (ref 15–41)
Albumin: 3.1 g/dL — ABNORMAL LOW (ref 3.5–5.0)
Alkaline Phosphatase: 113 U/L (ref 38–126)
Bilirubin, Direct: <0.1 mg/dL (ref 0.0–0.2)
Total Bilirubin: 0.5 mg/dL (ref 0.0–1.2)
Total Protein: 5.8 g/dL — ABNORMAL LOW (ref 6.5–8.1)

## 2024-03-31 LAB — APTT: aPTT: 26 s (ref 24–36)

## 2024-03-31 LAB — PROTIME-INR
INR: 1.1 (ref 0.8–1.2)
Prothrombin Time: 14.1 s (ref 11.4–15.2)

## 2024-03-31 MED ORDER — LOPERAMIDE HCL 2 MG PO CAPS: 2.0000 mg | ORAL_CAPSULE | ORAL | Status: DC | PRN

## 2024-03-31 MED ORDER — BACITRACIN-NEOMYCIN-POLYMYXIN OINTMENT TUBE
TOPICAL_OINTMENT | Freq: Three times a day (TID) | CUTANEOUS | Status: DC
Start: 2024-03-31 — End: 2024-04-01
  Administered 2024-03-31 – 2024-04-01 (×2): 1 via TOPICAL
  Filled 2024-03-31 (×2): qty 14.17

## 2024-03-31 MED ORDER — DOXYCYCLINE HYCLATE 100 MG PO TABS
100.0000 mg | ORAL_TABLET | Freq: Two times a day (BID) | ORAL | Status: DC
  Administered 2024-03-31 – 2024-04-01 (×3): 100 mg via ORAL
  Filled 2024-03-31 (×3): qty 1

## 2024-03-31 MED ORDER — LOPERAMIDE HCL 2 MG PO CAPS: 2.0000 mg | ORAL_CAPSULE | Freq: Four times a day (QID) | ORAL | Status: DC | PRN

## 2024-03-31 MED ORDER — TRAMADOL HCL 50 MG PO TABS
100.0000 mg | ORAL_TABLET | Freq: Four times a day (QID) | ORAL | Status: DC | PRN
  Administered 2024-03-31: 100 mg via ORAL
  Filled 2024-03-31: qty 2

## 2024-03-31 MED ORDER — TRAMADOL HCL 50 MG PO TABS
100.0000 mg | ORAL_TABLET | Freq: Two times a day (BID) | ORAL | Status: DC | PRN
  Administered 2024-03-31 – 2024-04-01 (×2): 100 mg via ORAL
  Filled 2024-03-31 (×2): qty 2

## 2024-03-31 NOTE — Progress Notes (Signed)
 Patient medicated for N/V/D, reports she has not had any actual vomiting since this morning, has been tolerating po fluids such as soda, tea, educated on introducing bland substances into diet like water, she stated she doesn't like water, medicated for pain with effective results, in bed resting, call light in reach

## 2024-03-31 NOTE — Progress Notes (Signed)
 PROGRESS NOTE  Nicole Mccoy  DOB: Aug 21, 1981  PCP: Nicole Mccoy, [place] ZOX:[id number]  DOA: 03/30/2024  LOS: 1 day  Hospital Day: 2  Brief narrative: Nicole Mccoy is a 43 y.o. female with PMH significant for migraine, fibromyalgia, Raynaud's syndrome 4/29, patient presented to the ED with complaint of 5-days of nausea, vomiting, diarrhea, minimal oral intake leading to progressive weakness. In ED, patient was afebrile, tachycardic Initial labs with WBC count normal, BUN/creatinine normal, serum bicarb low at 11, AST, ALT elevated with baseline Urinalysis showed hazy yellow urine with 15 ketones, trace leukocytes, many bacteria, negative nitrite resolution hyperlipidemia CT abdomen pelvis unremarkable for acute finding except for small uterine fibroid. Admitted to [facility] Stool studies sent on admission showed enteropathogenic E. coli.  Subjective: Patient was seen and examined this afternoon. Pleasant young Caucasian female. Propped up in bed.  No vomiting since Sunday.  Continues to have loose bowel movement Also showed me an area of paronychia with surrounding inflammation around left great toe Chart reviewed.  Afebrile, tachycardia improving Last set of labs from this morning with WBC count slightly low at 3.9, hemoglobin normal at 12, potassium low at 3.2, AST/ALT improving  Assessment and plan: Acute gastroenteritis with EPEC Presented with 5-days of nausea, vomiting, diarrhea, minimal oral intake leading to progressive weakness. Stool studies sent on admission showed enteropathogenic E. coli. Currently not on any antibiotics. No vomiting since Sunday.  Continues to have loose bowel movement.  Start Imodium as needed Tolerating liquid diet.  Advance to regular consistency diet Continue IV fluid for next 24 hours  Left great toe paronychia Patient has a paronychia on left great toe with surrounding inflammation Start topical Neosporin ointment and 2 days of oral  doxycycline May need abscess lanced tomorrow if no spontaneous release by tomorrow   Starvation ketoacidosis High anion gap metabolic acidosis Elevated ketone level and low serum bicarb due to starvation.  Improving with IV fluid. Recent Labs    03/30/24 0912 03/30/24 1306 03/30/24 1813 03/31/24 0612  BUN 13 10 10 6   CREATININE 0.69 0.61 0.45 0.46  CO2 11* 13* 18* 18*   Elevated liver enzymes Improving as below. Acute hepatitis panel unremarkable Recent Labs  Lab 03/30/24 0912 03/30/24 1306 03/31/24 0612  AST 207* 135* 76*  ALT 104* 79* 57*  ALKPHOS 164* 137* 113  BILITOT <0.2 <0.2 0.5  BILIDIR  --   --  <0.1  IBILI  --   --  NOT CALCULATED  PROT 6.7 5.7* 5.8*  ALBUMIN 4.1 3.6 3.1*  INR  --   --  1.1  LIPASE 16  --   --   PLT 272  --  243    Chronic sinus tachycardia Worsened with dehydration.  Improving.    Fibroid Incidental. Discussed with patient. She will seek outpatient follow up with PCP/OBGYN. Reports no abnormal bleeding.   Migraine C/w topamax.   Anxiety Chrnic. C.w. clonazepam (PDMP reviewed) and celexa   Fibromyalgia C/w tramadol, celexa, flexeril , gabapentin.   Mobility: Current ambulation  Goals of care   Code Status: Full Code     DVT prophylaxis:  enoxaparin (LOVENOX) injection 40 mg Start: 03/30/24 2200 SCDs Start: 03/30/24 1705   Antimicrobials: Oral Doxylin, topical Neosporin Fluid: Currently on D5 LR at 125 mL/h Consultants: None Family Communication: None at bedside  Status: Inpatient Level of care:  Telemetry   Patient is from: Home Needs to continue in-hospital care: Continues to have diarrhea Anticipated d/c to: Hopefully home in  1 to 2 days      Diet:  Diet Order             Diet regular Room service appropriate? Yes; Fluid consistency: Thin  Diet effective now                   Scheduled Meds:  citalopram  40 mg Oral QHS   doxycycline  100 mg Oral Q12H   enoxaparin (LOVENOX) injection  40 mg  Subcutaneous Q24H   gabapentin  600 mg Oral BID   neomycin-bacitracin-polymyxin   Topical TID   sodium chloride  flush  3 mL Intravenous Q12H   topiramate  200 mg Oral QHS    PRN meds: acetaminophen  **OR** acetaminophen , clonazePAM, cyclobenzaprine , loperamide, ondansetron  (ZOFRAN ) IV, polyethylene glycol, promethazine  (PHENERGAN ) injection (IM or IVPB), traMADol   Infusions:   dextrose  5% lactated ringers 125 mL/hr at 03/31/24 1311   promethazine  (PHENERGAN ) injection (IM or IVPB) Stopped (03/31/24 0717)    Antimicrobials: Anti-infectives (From admission, onward)    Start     Dose/Rate Route Frequency Ordered Stop   03/31/24 1500  doxycycline (VIBRA-TABS) tablet 100 mg        100 mg Oral Every 12 hours 03/31/24 1412         Objective: Vitals:   03/31/24 0634 03/31/24 1047  BP: (!) 132/95 121/82  Pulse: 86 91  Resp: 18 16  Temp: 98.2 F (36.8 C) 98.5 F (36.9 C)  SpO2: 100% 100%    Intake/Output Summary (Last 24 hours) at 03/31/2024 1416 Last data filed at 03/31/2024 1308 Gross per 24 hour  Intake 1170.8 ml  Output --  Net 1170.8 ml   Filed Weights   03/30/24 0905  Weight: 68 kg   Weight change:  Body mass index is 25.75 kg/m.   Physical Exam: General exam: Pleasant, young Caucasian female Skin: No rashes, lesions or ulcers. HEENT: Atraumatic, normocephalic, no obvious bleeding Lungs: Clear to auscultation bilaterally,  CVS: S1, S2, no murmur,   GI/Abd: Soft, nontender, nondistended, bowel sound present,   CNS: Alert, awake, oriented x 3 Psychiatry: Mood appropriate,  Extremities: No pedal edema, no calf tenderness,   Data Review: I have personally reviewed the laboratory data and studies available.  F/u labs ordered Unresulted Labs (From admission, onward)     Start     Ordered   04/06/24 0500  Creatinine, serum  (enoxaparin (LOVENOX)    CrCl >/= 30 ml/min)  Weekly,   R     Comments: while on enoxaparin therapy    03/30/24 1705            Signed, Hoyt Macleod, MD [facility] 03/31/2024

## 2024-03-31 NOTE — Plan of Care (Signed)

## 2024-03-31 NOTE — Progress Notes (Signed)
 Date and time results received: 03/31/24 9:35 AM  Test: GI panel   Critical Value: positive for Enteropathogenic E coli   Name of Provider Notified: [place]

## 2024-04-01 MED ORDER — PROMETHAZINE HCL 12.5 MG PO TABS
12.5000 mg | ORAL_TABLET | Freq: Four times a day (QID) | ORAL | 0 refills | Status: DC | PRN
Start: 2024-04-01 — End: 2024-06-30

## 2024-04-01 MED ORDER — LOPERAMIDE HCL 2 MG PO CAPS: 2.0000 mg | ORAL_CAPSULE | Freq: Four times a day (QID) | ORAL | 0 refills | Status: AC | PRN

## 2024-04-01 MED ORDER — TRIPLE ANTIBIOTIC 5-400-5000 EX OINT: TOPICAL_OINTMENT | Freq: Four times a day (QID) | CUTANEOUS | 0 refills | Status: AC

## 2024-04-01 NOTE — Plan of Care (Signed)

## 2024-04-01 NOTE — TOC Initial Note (Addendum)
 Transition of Care [facility]) - Initial/Assessment Note    Patient Details  Name: Nicole Mccoy MRN: [id number] Date of Birth: 1981-09-10  Transition of Care [facility]) CM/SW Contact:    Levie Ream, RN Phone Number: 04/01/2024, 11:22 AM  Clinical Narrative:                 No PCP listed; spoke w/ pt in room; pt says she lives at home, and plans to return at d/c; pt identified POC mother Nicole Mccoy [phone]); her family will provide transportation; pt verified PCP; pt says she does not have insurance; she denied SDOH risks; pt says she does not have DME, HH services, or home oxygen; pt declined resources for social services; no TOC needs; TOC is signing off; please place consult if needed.  Expected Discharge Plan: Home/Self Care Barriers to Discharge: Continued Medical Work up   Patient Goals and CMS Choice Patient states their goals for this hospitalization and ongoing recovery are:: home          Expected Discharge Plan and Services   Discharge Planning Services: CM Consult   Living arrangements for the past 2 months: Single Family Home Expected Discharge Date: 04/01/24                                    Prior Living Arrangements/Services Living arrangements for the past 2 months: Single Family Home Lives with:: Self Patient language and need for interpreter reviewed:: Yes Do you feel safe going back to the place where you live?: Yes      Need for Family Participation in Patient Care: Yes (Comment) Care giver support system in place?: Yes (comment) Current home services:  (n/a) Criminal Activity/Legal Involvement Pertinent to Current Situation/Hospitalization: No - Comment as needed  Activities of Daily Living   ADL Screening (condition at time of admission) Independently performs ADLs?: Yes (appropriate for developmental age) Is the patient deaf or have difficulty hearing?: No Does the patient have difficulty seeing, even when wearing  glasses/contacts?: No Does the patient have difficulty concentrating, remembering, or making decisions?: No  Permission Sought/Granted Permission sought to share information with : Case Manager Permission granted to share information with : Yes, Verbal Permission Granted  Share Information with NAME: Case Manager     Permission granted to share info w Relationship: Nicole Mccoy (mother) [phone]     Emotional Assessment Appearance:: Appears stated age Attitude/Demeanor/Rapport: Gracious Affect (typically observed): Accepting Orientation: : Oriented to Self, Oriented to Place, Oriented to  Time, Oriented to Situation Alcohol / Substance Use: Not Applicable Psych Involvement: No (comment)  Admission diagnosis:  Dehydration [E86.0] High anion gap metabolic acidosis [E87.29] Nausea vomiting and diarrhea [R11.2, R19.7] Acidosis [E87.20] Patient Active Problem List   Diagnosis Date Noted   High anion gap metabolic acidosis 03/30/2024   Acidosis 03/30/2024   Fibroid 03/30/2024   Elevated LFTs 03/30/2024   Gastroenteritis 03/30/2024   Tachycardia 03/30/2024   Anxiety 03/30/2024   Rash 04/10/2012   Fibromyalgia 04/10/2012   Migraine 04/10/2012   PCP:  Nicole Hidalgo, PA-C Pharmacy:   [facility] [id number] - [place], [place] - [address] [place] [address] Phone: [phone] Fax: [phone]     Social Drivers of Health (SDOH) Social History: SDOH Screenings   Food Insecurity: No Food Insecurity (04/01/2024)  Housing: Low Risk  (04/01/2024)  Transportation Needs: No Transportation Needs (  04/01/2024)  Utilities: Not At Risk (04/01/2024)  Financial Resource Strain: Low Risk  (12/08/2023)   Received from [facility]  Physical Activity: Unknown (12/08/2023)   Received from [facility]  Social Connections: Somewhat Isolated (12/08/2023)   Received from [facility]  Stress: Stress Concern Present (12/08/2023)   Received  from [facility]  Tobacco Use: Medium Risk (03/30/2024)   SDOH Interventions: Food Insecurity Interventions: Intervention Not Indicated, Inpatient TOC Housing Interventions: Intervention Not Indicated, Inpatient TOC Transportation Interventions: Intervention Not Indicated, Inpatient TOC Utilities Interventions: Intervention Not Indicated, Inpatient TOC   Readmission Risk Interventions     No data to display

## 2024-04-01 NOTE — Discharge Summary (Signed)
 Physician Discharge Summary  AMIR WITZ ZOX:[id number] DOB: 1981-10-07 DOA: 03/30/2024  PCP: Zella Hidalgo, PA-C  Admit date: 03/30/2024 Discharge date: 04/01/2024  Admitted From: Home Discharge disposition: Home  Recommendations at discharge:  Increase oral hydration Continue to monitor diarrheal frequency, take Imodium  as needed.  Expected to improve in next 1 to 2 days Neosporin ointment for left great toe paronychia Follow-up with PCP as an outpatient for repeat LFTs  Brief narrative: Nicole Mccoy is a 43 y.o. female with PMH significant for migraine, fibromyalgia, Raynaud's syndrome 4/29, patient presented to the ED with complaint of 5-days of nausea, vomiting, diarrhea, minimal oral intake leading to progressive weakness. In ED, patient was afebrile, tachycardic Initial labs with WBC count normal, BUN/creatinine normal, serum bicarb low at 11, AST, ALT elevated with baseline Urinalysis showed hazy yellow urine with 15 ketones, trace leukocytes, many bacteria, negative nitrite resolution hyperlipidemia CT abdomen pelvis unremarkable for acute finding except for small uterine fibroid. Admitted to [facility] Stool studies sent on admission showed enteropathogenic E. coli.  Subjective: Patient was seen and examined this morning. Lying on bed.  Had multiple watery bowel movements in the last 24 hours but slowing down.  No vomiting.  Able to tolerate regular diet.  Was ordered for Imodium  as needed but for some reason could not get it. Daughter at bedside. Patient wants to go home today.  Lives 15 minutes away.  Hospital course: Acute gastroenteritis with EPEC Presented with 5-days of nausea, vomiting, diarrhea, minimal oral intake leading to progressive weakness. Stool studies sent on admission showed enteropathogenic E. coli. Currently not on any antibiotics. No vomiting since Sunday.  Able to tolerate regular diet.   Continues to have loose bowel movement.  Yesterday, I  started her on Imodium  as needed but for some reason, she could not get it. Patient wants to go home.  I advised her to increase oral hydration monitor normal bowel movements and take Imodium  as needed.  Left great toe paronychia Yesterday, she patient mentioned to me about a paronychia on left great toe with surrounding inflammation. I started her on oral doxycycline  and topical Neosporin ointment. It spontaneously opened up last night.  She put a bandage on top of it. I will continue aspirin ointment as an outpatient.  No need of antibiotics.  Starvation ketoacidosis High anion gap metabolic acidosis Elevated ketone level and low serum bicarb due to starvation.  Improving with IV fluid.  Continue oral hydration at home Recent Labs    03/30/24 0912 03/30/24 1306 03/30/24 1813 03/31/24 0612  BUN 13 10 10 6   CREATININE 0.69 0.61 0.45 0.46  CO2 11* 13* 18* 18*   Elevated liver enzymes Improving as below. Acute hepatitis panel unremarkable Follow-up with PCP as an outpatient for repeat LFTs in 1 to 2 weeks. Recent Labs  Lab 03/30/24 0912 03/30/24 1306 03/31/24 0612  AST 207* 135* 76*  ALT 104* 79* 57*  ALKPHOS 164* 137* 113  BILITOT <0.2 <0.2 0.5  BILIDIR  --   --  <0.1  IBILI  --   --  NOT CALCULATED  PROT 6.7 5.7* 5.8*  ALBUMIN 4.1 3.6 3.1*  INR  --   --  1.1  LIPASE 16  --   --   PLT 272  --  243    Chronic sinus tachycardia Worsened with dehydration.  Improving.    Fibroid Incidental. Discussed with patient. She will seek outpatient follow up with PCP/OBGYN. Reports no abnormal bleeding.  Migraine C/w topamax .   Anxiety Chrnic. C.w. clonazepam  (PDMP reviewed) and celexa    Fibromyalgia C/w tramadol , celexa , flexeril , gabapentin .   Goals of care   Code Status: Full Code   Consultants: None Family Communication: Daughter at bedside  Diet:  Diet Order             Diet general           Diet regular Room service appropriate? Yes; Fluid consistency:  Thin  Diet effective now                   Nutritional status:  Body mass index is 25.75 kg/m.       Wounds:  -    Discharge Exam:   Vitals:   03/31/24 1300 03/31/24 1529 03/31/24 2116 04/01/24 0540  BP:   (!) 120/90 119/88  Pulse:    79  Resp: 17 19 16 16   Temp:   98.8 F (37.1 C) 98.4 F (36.9 C)  TempSrc:   Oral Oral  SpO2:   99% 96%  Weight:      Height:        Body mass index is 25.75 kg/m.   General exam: Pleasant, young Caucasian female Skin: No rashes, lesions or ulcers. HEENT: Atraumatic, normocephalic, no obvious bleeding Lungs: Clear to auscultation bilaterally,  CVS: S1, S2, no murmur,   GI/Abd: Soft, nontender, nondistended, bowel sound present,   CNS: Alert, awake, oriented x 3 Psychiatry: Mood appropriate,  Extremities: No pedal edema, no calf tenderness, left great toe paronychia improving.  Follow ups:    Follow-up Information     Zella Hidalgo, PA-C Follow up.   Specialties: Physician Assistant, Internal Medicine Contact information: [address] [place] [address] [phone]                 Discharge Instructions:   Discharge Instructions     Call MD for:  difficulty breathing, headache or visual disturbances   Complete by: As directed    Call MD for:  extreme fatigue   Complete by: As directed    Call MD for:  hives   Complete by: As directed    Call MD for:  persistant dizziness or light-headedness   Complete by: As directed    Call MD for:  persistant nausea and vomiting   Complete by: As directed    Call MD for:  severe uncontrolled pain   Complete by: As directed    Call MD for:  temperature >100.4   Complete by: As directed    Diet general   Complete by: As directed    Discharge instructions   Complete by: As directed    Recommendations at discharge:   Increase oral hydration  Continue to monitor diarrheal frequency, take Imodium  as needed.  Expected to improve in next 1 to 2  days  Neosporin ointment for left great toe paronychia  Follow-up with PCP as an outpatient for repeat LFTs  General discharge instructions: Follow with Primary MD Zella Hidalgo, PA-C in 7 days  Please request your PCP  to go over your hospital tests, procedures, radiology results at the follow up. Please get your medicines reviewed and adjusted.  Your PCP may decide to repeat certain labs or tests as needed. Do not drive, operate heavy machinery, perform activities at heights, swimming or participation in water activities or provide baby sitting services if your were admitted for syncope or siezures until you have seen by Primary MD or  a Neurologist and advised to do so again. [place]  Controlled Substance Reporting System database was reviewed. Do not drive, operate heavy machinery, perform activities at heights, swim, participate in water activities or provide baby-sitting services while on medications for pain, sleep and mood until your outpatient physician has reevaluated you and advised to do so again.  You are strongly recommended to comply with the dose, frequency and duration of prescribed medications. Activity: As tolerated with Full fall precautions use walker/cane & assistance as needed Avoid using any recreational substances like cigarette, tobacco, alcohol, or non-prescribed drug. If you experience worsening of your admission symptoms, develop shortness of breath, life threatening emergency, suicidal or homicidal thoughts you must seek medical attention immediately by calling 911 or calling your MD immediately  if symptoms less severe. You must read complete instructions/literature along with all the possible adverse reactions/side effects for all the medicines you take and that have been prescribed to you. Take any new medicine only after you have completely understood and accepted all the possible adverse reactions/side effects.  Wear Seat belts while driving. You were cared  for by a hospitalist during your hospital stay. If you have any questions about your discharge medications or the care you received while you were in the hospital after you are discharged, you can call the unit and ask to speak with the hospitalist or the covering physician. Once you are discharged, your primary care physician will handle any further medical issues. Please note that NO REFILLS for any discharge medications will be authorized once you are discharged, as it is imperative that you return to your primary care physician (or establish a relationship with a primary care physician if you do not have one).   Increase activity slowly   Complete by: As directed        Discharge Medications:   Allergies as of 04/01/2024       Reactions   Sulfa Antibiotics Anaphylaxis   Ibuprofen  Other (See Comments)   Lorazepam  Other (See Comments)   -Does not help with symptoms        Medication List     STOP taking these medications    ciprofloxacin  500 MG tablet Commonly known as: CIPRO    diphenoxylate-atropine 2.5-0.025 MG tablet Commonly known as: LOMOTIL       TAKE these medications    Botox 200 units injection Generic drug: botulinum toxin Type A Inject 200 Units into the muscle every 3 (three) months.   CELEXA  PO Take 40 mg by mouth at bedtime.   clonazePAM  1 MG tablet Commonly known as: KLONOPIN  Take 1 mg by mouth 2 (two) times daily as needed for anxiety.   cyclobenzaprine  10 MG tablet Commonly known as: FLEXERIL  Take 10 mg by mouth 3 (three) times daily as needed for muscle spasms.   gabapentin  300 MG capsule Commonly known as: NEURONTIN  Take 600 mg by mouth 2 (two) times daily.   loperamide  2 MG capsule Commonly known as: IMODIUM  Take 1 capsule (2 mg total) by mouth every 6 (six) hours as needed for diarrhea or loose stools.   methylphenidate 27 MG CR tablet Commonly known as: CONCERTA Take 27 mg by mouth daily.   neomycin -bacitracin -polymyxin [phone]  ointment Apply topically 4 (four) times daily for 7 days.   TOPAMAX  PO Take 200 mg by mouth daily.   traMADol  50 MG tablet Commonly known as: ULTRAM  Take 50 mg by mouth every 6 (six) hours as needed for moderate pain (pain score 4-6).  The results of significant diagnostics from this hospitalization (including imaging, microbiology, ancillary and laboratory) are listed below for reference.    Procedures and Diagnostic Studies:   CT ABDOMEN PELVIS W CONTRAST Result Date: 03/30/2024 CLINICAL DATA:  Abdominal pain, nausea and vomiting, and diarrhea for 5 days. EXAM: CT ABDOMEN AND PELVIS WITH CONTRAST TECHNIQUE: Multidetector CT imaging of the abdomen and pelvis was performed using the standard protocol following bolus administration of intravenous contrast. RADIATION DOSE REDUCTION: This exam was performed according to the departmental dose-optimization program which includes automated exposure control, adjustment of the mA and/or kV according to patient size and/or use of iterative reconstruction technique. CONTRAST:  OMNIPAQUE  IOHEXOL  300 MG/ML  SOLN COMPARISON:  None Available. FINDINGS: Lower Chest: No acute findings. Hepatobiliary: No suspicious hepatic masses identified. Gallbladder is unremarkable. No evidence of biliary ductal dilatation. Pancreas:  No mass or inflammatory changes. Spleen: Within normal limits in size and appearance. Adrenals/Urinary Tract: No suspicious masses identified. No evidence of ureteral calculi or hydronephrosis. Unremarkable unopacified urinary bladder. Stomach/Bowel: No evidence of obstruction, inflammatory process or abnormal fluid collections. Normal appendix visualized. Vascular/Lymphatic: No pathologically enlarged lymph nodes. No acute vascular findings. Reproductive: 3 cm subserosal fibroid noted in the left lateral uterine corpus. Adnexal regions are unremarkable. Other:  None. Musculoskeletal:  No suspicious bone lesions identified.  IMPRESSION: No acute findings. 3 cm uterine fibroid. Electronically Signed   By: Marlyce Sine M.D.   On: 03/30/2024 11:50     Labs:   Basic Metabolic Panel: Recent Labs  Lab 03/30/24 0912 03/30/24 0934 03/30/24 1207 03/30/24 1306 03/30/24 1813 03/31/24 0612  NA 137  --  134* 133* 137 137  K 4.2  --  4.4 4.0 3.6 3.2*  CL 101  --   --  103 107 107  CO2 11*  --   --  13* 18* 18*  GLUCOSE 46*  --   --  231* 90 84  BUN 13  --   --  10 10 6   CREATININE 0.69  --   --  0.61 0.45 0.46  CALCIUM 8.2*  --   --  7.2* 7.9* 8.2*  MG  --  1.9  --   --   --   --    GFR Estimated Creatinine Clearance: 87.7 mL/min (by C-G formula based on SCr of 0.46 mg/dL). Liver Function Tests: Recent Labs  Lab 03/30/24 0912 03/30/24 1306 03/31/24 0612  AST 207* 135* 76*  ALT 104* 79* 57*  ALKPHOS 164* 137* 113  BILITOT <0.2 <0.2 0.5  PROT 6.7 5.7* 5.8*  ALBUMIN 4.1 3.6 3.1*   Recent Labs  Lab 03/30/24 0912  LIPASE 16   No results for input(s): "AMMONIA" in the last 168 hours. Coagulation profile Recent Labs  Lab 03/31/24 0612  INR 1.1    CBC: Recent Labs  Lab 03/30/24 0912 03/30/24 1207 03/31/24 0612  WBC 8.6  --  3.9*  HGB 13.1 12.6 12.1  HCT 39.6 37.0 36.6  MCV 106.2*  --  105.5*  PLT 272  --  243   Cardiac Enzymes: No results for input(s): "CKTOTAL", "CKMB", "CKMBINDEX", "TROPONINI" in the last 168 hours. BNP: Invalid input(s): "POCBNP" CBG: Recent Labs  Lab 03/30/24 1117 03/30/24 1242 03/30/24 1836 03/30/24 1925 03/30/24 2323  GLUCAP 46* 281* 61* 107* 89   D-Dimer No results for input(s): "DDIMER" in the last 72 hours. Hgb A1c No results for input(s): "HGBA1C" in the last 72 hours. Lipid Profile No results for input(s): "CHOL", "  HDL", "LDLCALC", "TRIG", "CHOLHDL", "LDLDIRECT" in the last 72 hours. Thyroid function studies Recent Labs    03/31/24 0612  TSH 6.481*   Anemia work up No results for input(s): "VITAMINB12", "FOLATE", "FERRITIN", "TIBC", "IRON",  "RETICCTPCT" in the last 72 hours. Microbiology Recent Results (from the past 240 hours)  Gastrointestinal Panel by PCR , Stool     Status: Abnormal   Collection Time: 03/30/24  8:14 PM   Specimen: STOOL  Result Value Ref Range Status   Campylobacter species NOT DETECTED NOT DETECTED Final   Plesimonas shigelloides NOT DETECTED NOT DETECTED Final   Salmonella species NOT DETECTED NOT DETECTED Final   Yersinia enterocolitica NOT DETECTED NOT DETECTED Final   Vibrio species NOT DETECTED NOT DETECTED Final   Vibrio cholerae NOT DETECTED NOT DETECTED Final   Enteroaggregative E coli (EAEC) NOT DETECTED NOT DETECTED Final   Enteropathogenic E coli (EPEC) DETECTED (A) NOT DETECTED Final    Comment: RESULT CALLED TO, READ BACK BY AND VERIFIED WITH: KAITLYN BODKINS 03/31/24 0928 KLW    Enterotoxigenic E coli (ETEC) NOT DETECTED NOT DETECTED Final   Shiga like toxin producing E coli (STEC) NOT DETECTED NOT DETECTED Final   Shigella/Enteroinvasive E coli (EIEC) NOT DETECTED NOT DETECTED Final   Cryptosporidium NOT DETECTED NOT DETECTED Final   Cyclospora cayetanensis NOT DETECTED NOT DETECTED Final   Entamoeba histolytica NOT DETECTED NOT DETECTED Final   Giardia lamblia NOT DETECTED NOT DETECTED Final   Adenovirus F40/41 NOT DETECTED NOT DETECTED Final   Astrovirus NOT DETECTED NOT DETECTED Final   Norovirus GI/GII NOT DETECTED NOT DETECTED Final   Rotavirus A NOT DETECTED NOT DETECTED Final   Sapovirus (I, II, IV, and V) NOT DETECTED NOT DETECTED Final    Comment: Performed at [facility], [address] [place], [place] [address]  C Difficile Quick Screen w PCR reflex     Status: None   Collection Time: 03/30/24  8:14 PM   Specimen: STOOL  Result Value Ref Range Status   C Diff antigen NEGATIVE NEGATIVE Final   C Diff toxin NEGATIVE NEGATIVE Final   C Diff interpretation No C. difficile detected.  Final    Comment: Performed at [facility], [address] [place], [place] [address]    Time coordinating discharge: 45 minutes  Signed: Aminta Mccoy Nicole Mccoy  [facility] 04/01/2024, 11:10 AM

## 2024-04-04 ENCOUNTER — Emergency Department (HOSPITAL_BASED_OUTPATIENT_CLINIC_OR_DEPARTMENT_OTHER): Payer: Self-pay

## 2024-04-04 ENCOUNTER — Emergency Department (HOSPITAL_BASED_OUTPATIENT_CLINIC_OR_DEPARTMENT_OTHER)
Admission: EM | Admit: 2024-04-04 | Discharge: 2024-04-04 | Disposition: A | Discharge status: Home / Self Care | Payer: Self-pay | Attending: Emergency Medicine | Admitting: Emergency Medicine

## 2024-04-04 ENCOUNTER — Encounter (HOSPITAL_BASED_OUTPATIENT_CLINIC_OR_DEPARTMENT_OTHER): Payer: Self-pay | Admitting: Emergency Medicine

## 2024-04-04 DIAGNOSIS — R079 Chest pain, unspecified: Secondary | ICD-10-CM

## 2024-04-04 DIAGNOSIS — E872 Acidosis, unspecified: Secondary | ICD-10-CM | POA: Insufficient documentation

## 2024-04-04 DIAGNOSIS — I808 Phlebitis and thrombophlebitis of other sites: Secondary | ICD-10-CM

## 2024-04-04 DIAGNOSIS — I82611 Acute embolism and thrombosis of superficial veins of right upper extremity: Secondary | ICD-10-CM | POA: Insufficient documentation

## 2024-04-04 DIAGNOSIS — R0789 Other chest pain: Secondary | ICD-10-CM | POA: Insufficient documentation

## 2024-04-04 LAB — CBC WITH DIFFERENTIAL/PLATELET
Abs Immature Granulocytes: 0.01 10*3/uL (ref 0.00–0.07)
Basophils Absolute: 0 10*3/uL (ref 0.0–0.1)
Basophils Relative: 1 %
Eosinophils Absolute: 0.1 10*3/uL (ref 0.0–0.5)
Eosinophils Relative: 2 %
HCT: 36.4 % (ref 36.0–46.0)
Hemoglobin: 12.6 g/dL (ref 12.0–15.0)
Immature Granulocytes: 0 %
Lymphocytes Relative: 23 %
Lymphs Abs: 1 10*3/uL (ref 0.7–4.0)
MCH: 35.7 pg — ABNORMAL HIGH (ref 26.0–34.0)
MCHC: 34.6 g/dL (ref 30.0–36.0)
MCV: 103.1 fL — ABNORMAL HIGH (ref 80.0–100.0)
Monocytes Absolute: 0.6 10*3/uL (ref 0.1–1.0)
Monocytes Relative: 15 %
Neutro Abs: 2.5 10*3/uL (ref 1.7–7.7)
Neutrophils Relative %: 59 %
Platelets: 263 10*3/uL (ref 150–400)
RBC: 3.53 MIL/uL — ABNORMAL LOW (ref 3.87–5.11)
RDW: 14.1 % (ref 11.5–15.5)
WBC: 4.1 10*3/uL (ref 4.0–10.5)
nRBC: 0 % (ref 0.0–0.2)

## 2024-04-04 LAB — COMPREHENSIVE METABOLIC PANEL WITH GFR
ALT: 97 U/L — ABNORMAL HIGH (ref 0–44)
AST: 86 U/L — ABNORMAL HIGH (ref 15–41)
Albumin: 3.8 g/dL (ref 3.5–5.0)
Alkaline Phosphatase: 141 U/L — ABNORMAL HIGH (ref 38–126)
Anion gap: 15 (ref 5–15)
BUN: <5 mg/dL — ABNORMAL LOW (ref 6–20)
CO2: 17 mmol/L — ABNORMAL LOW (ref 22–32)
Calcium: 9.2 mg/dL (ref 8.9–10.3)
Chloride: 108 mmol/L (ref 98–111)
Creatinine, Ser: 0.65 mg/dL (ref 0.44–1.00)
GFR, Estimated: >60 mL/min (ref >60)
Glucose, Bld: 109 mg/dL — ABNORMAL HIGH (ref 70–99)
Potassium: 3.8 mmol/L (ref 3.5–5.1)
Sodium: 140 mmol/L (ref 135–145)
Total Bilirubin: <0.2 mg/dL (ref 0.0–1.2)
Total Protein: 6.3 g/dL — ABNORMAL LOW (ref 6.5–8.1)

## 2024-04-04 LAB — TROPONIN T, HIGH SENSITIVITY: Troponin T High Sensitivity: <15 ng/L (ref <19)

## 2024-04-04 LAB — HCG, SERUM, QUALITATIVE: Preg, Serum: NEGATIVE

## 2024-04-04 LAB — D-DIMER, QUANTITATIVE: D-Dimer, Quant: 0.55 ug{FEU}/mL — ABNORMAL HIGH (ref 0.00–0.50)

## 2024-04-04 MED ORDER — CEPHALEXIN 500 MG PO CAPS: 500.0000 mg | ORAL_CAPSULE | Freq: Three times a day (TID) | ORAL | 0 refills | Status: DC

## 2024-04-04 MED ORDER — ALUM & MAG HYDROXIDE-SIMETH 200-200-20 MG/5ML PO SUSP: 30.0000 mL | Freq: Once | ORAL | Status: DC

## 2024-04-04 MED ORDER — IOHEXOL 350 MG/ML SOLN
75.0000 mL | Freq: Once | INTRAVENOUS | Status: AC | PRN
  Administered 2024-04-04: 75 mL via INTRAVENOUS

## 2024-04-04 MED ORDER — CEPHALEXIN 500 MG PO CAPS: 500.0000 mg | ORAL_CAPSULE | Freq: Three times a day (TID) | ORAL | 0 refills | Status: AC

## 2024-04-04 NOTE — ED Triage Notes (Signed)
 Pt reports swelling and redness to prior IV  site to RFA; IV was started on Tues and removed Thurs per pt; multiple scratches noted to BUE as well- pt sts she has a 73 mo old pitbull that caused those

## 2024-04-04 NOTE — ED Notes (Signed)
 Pt sts HR is never under 100 bpm

## 2024-04-04 NOTE — ED Notes (Signed)
 Pt was recently admitted and [facility] staff started an IV in her RFA after she lost her other IV access. The pt advised she was poked several more times and was discharged and then noticed her RFA was selling proximal to the site. The pt did not endorse branching redness, or wheeping wounds, but there was a bruise and hematoma under the skin as well as localized redness and hotness to the touch.

## 2024-04-04 NOTE — ED Provider Notes (Signed)
 [place] [facility] Provider Note   CSN: [id number] Arrival date & time: 04/04/24  1403     History {Add pertinent medical, surgical, social history, OB history to HPI:1} Chief Complaint  Patient presents with   Wound Check    Nicole Mccoy is a 43 y.o. female.  HPI      43yo female with history of migraine, fibromyalgia, Raynaud's syndrome, recent admission with finding of enteropathogenic E. coli, dehydration and starvation ketosis with high anion gap metabolic acidosis who presents with concern for swelling and pain over her IV site in the distal right upper extremity, and also reports development of chest pain.  Reports she had an IV placed in her right forearm that was there during this hospitalization for 3 days.  She had removed, and has noticed since then she has developed increasing pain and swelling over the area, redness over the area with some minor extension proximally, also notes contusion and feeling of swelling.  She also has some scratches to the arm that are from her pitbull puppy.  She has not had any fevers, cough.  Review of systems she also reports that she has had chest pain which is difficult to describe the quality of.  Think it has been more sharp.  Initially she had it prior to leaving the hospital but felt like a soreness of her left side and was tender, but since then she has had chest pain in the left chest that is not been associated with tenderness to the chest.  She 1 point reported it does hurt to laugh.  She does not think she feels short of breath but is not confident that she does not.  Has not had leg pain or swelling.  Her other symptoms have improved.  She still has some mild nausea.  Reports her father has a history of mitral valve problems and is going to have surgery, and that this runs in his side of the family.  Past Medical History:  Diagnosis Date   Fibromyalgia    Migraine    Pneumonia    Raynaud's  syndrome      Home Medications Prior to Admission medications   Medication Sig Start Date End Date Taking? Authorizing Provider  botulinum toxin Type A (BOTOX) 200 units injection Inject 200 Units into the muscle every 3 (three) months. 03/05/24   [provider]  Citalopram  Hydrobromide (CELEXA  PO) Take 40 mg by mouth at bedtime.    [provider]  clonazePAM  (KLONOPIN ) 1 MG tablet Take 1 mg by mouth 2 (two) times daily as needed for anxiety.    [provider]  cyclobenzaprine  (FLEXERIL ) 10 MG tablet Take 10 mg by mouth 3 (three) times daily as needed for muscle spasms.    [provider]  gabapentin  (NEURONTIN ) 300 MG capsule Take 600 mg by mouth 2 (two) times daily. 03/23/18   [provider]  loperamide  (IMODIUM ) 2 MG capsule Take 1 capsule (2 mg total) by mouth every 6 (six) hours as needed for diarrhea or loose stools. 04/01/24   Hoyt Macleod, MD  methylphenidate 27 MG PO CR tablet Take 27 mg by mouth daily. 03/11/24 04/10/24  [provider]  neomycin -bacitracin -polymyxin (NEOSPORIN) [phone] ointment Apply topically 4 (four) times daily for 7 days. 04/01/24 04/08/24  Hoyt Macleod, MD  promethazine  (PHENERGAN ) 12.5 MG tablet Take 1 tablet (12.5 mg total) by mouth every 6 (six) hours as needed for nausea or vomiting. 04/01/24  Hoyt Macleod, MD  Topiramate  (TOPAMAX  PO) Take 200 mg by mouth daily.    [provider]  traMADol  (ULTRAM ) 50 MG tablet Take 50 mg by mouth every 6 (six) hours as needed for moderate pain (pain score 4-6).    [provider]      Allergies    Sulfa antibiotics, Ibuprofen , and Lorazepam     Review of Systems   Review of Systems  Physical Exam Updated Vital Signs BP (!) 118/95   Pulse (!) 118   Temp 98.7 F (37.1 C)   Resp 16   Ht 5\' 4"  (1.626 m)   Wt 68 kg   LMP 03/21/2024 (Exact Date)   SpO2 98%   BMI 25.73 kg/m  Physical Exam Vitals and nursing note reviewed.  Constitutional:       General: She is not in acute distress.    Appearance: She is well-developed. She is not diaphoretic.  HENT:     Head: Normocephalic and atraumatic.  Eyes:     Conjunctiva/sclera: Conjunctivae normal.  Cardiovascular:     Rate and Rhythm: Normal rate and regular rhythm.     Heart sounds: Normal heart sounds. No murmur heard.    No friction rub. No gallop.  Pulmonary:     Effort: Pulmonary effort is normal. No respiratory distress.     Breath sounds: Normal breath sounds. No wheezing or rales.  Abdominal:     General: There is no distension.     Palpations: Abdomen is soft.     Tenderness: There is no abdominal tenderness. There is no guarding.  Musculoskeletal:        General: Tenderness (right forearm 2cm area of induration, swelling, tenderness) present.     Cervical back: Normal range of motion.  Skin:    General: Skin is warm and dry.     Findings: Erythema (mild erythema overlaying area of forearm, contusion) present. No rash.  Neurological:     Mental Status: She is alert and oriented to person, place, and time.     ED Results / Procedures / Treatments   Labs (all labs ordered are listed, but only abnormal results are displayed) Labs Reviewed  CBC WITH DIFFERENTIAL/PLATELET  COMPREHENSIVE METABOLIC PANEL WITH GFR  D-DIMER, QUANTITATIVE  HCG, SERUM, QUALITATIVE  TROPONIN T, HIGH SENSITIVITY    EKG None  Radiology No results found.  Procedures Procedures  {Document cardiac monitor, telemetry assessment procedure when appropriate:1}  Medications Ordered in ED Medications - No data to display  ED Course/ Medical Decision Making/ A&P   {   Click here for ABCD2, HEART and other calculatorsREFRESH Note before signing :1}                                43yo female with history of migraine, fibromyalgia, Raynaud's syndrome, recent admission with finding of enteropathogenic E. coli, dehydration and starvation ketosis with high anion gap metabolic acidosis who  presents with concern for swelling and pain over her IV site in the distal right upper extremity, and also reports development of chest pain.  Regarding arm pain-do not feel exam is consistent with underlying abscess, feel induration and findings likely consistent with a superficial thrombophlebitis.  DVT study was completed and shows***.  Will cover with oral antibiotic for possible cellulitis and superficial thrombophlebitis.  Regarding chest pain- Differential diagnosis for chest pain includes pulmonary embolus, dissection, pneumothorax, pneumonia, ACS, myocarditis, pericarditis.    EKG was  done and evaluate by me and showed no acute ST changes and no signs of pericarditis***.   Chest x-ray was done and evaluated by me and radiology and showed*** no sign of pneumonia or pneumothorax.    Labs are dated personally by interpreted by me shows***.  DDimer ordered giveh tachycardia, recent hospitalization is ***    {Document critical care time when appropriate:1} {Document review of labs and clinical decision tools ie heart score, Chads2Vasc2 etc:1}  {Document your independent review of radiology images, and any outside records:1} {Document your discussion with family members, caretakers, and with consultants:1} {Document social determinants of health affecting pt's care:1} {Document your decision making why or why not admission, treatments were needed:1} Final Clinical Impression(s) / ED Diagnoses Final diagnoses:  Chest pain, unspecified type    Rx / DC Orders ED Discharge Orders     None

## 2024-05-06 ENCOUNTER — Encounter (HOSPITAL_BASED_OUTPATIENT_CLINIC_OR_DEPARTMENT_OTHER): Payer: Self-pay | Admitting: Emergency Medicine

## 2024-05-06 ENCOUNTER — Emergency Department (HOSPITAL_BASED_OUTPATIENT_CLINIC_OR_DEPARTMENT_OTHER): Payer: Self-pay

## 2024-05-06 ENCOUNTER — Other Ambulatory Visit: Payer: Self-pay

## 2024-05-06 ENCOUNTER — Emergency Department (HOSPITAL_BASED_OUTPATIENT_CLINIC_OR_DEPARTMENT_OTHER)
Admission: EM | Admit: 2024-05-06 | Discharge: 2024-05-06 | Disposition: A | Discharge status: Home / Self Care | Payer: Self-pay | Attending: Emergency Medicine | Admitting: Emergency Medicine

## 2024-05-06 DIAGNOSIS — E876 Hypokalemia: Secondary | ICD-10-CM | POA: Insufficient documentation

## 2024-05-06 DIAGNOSIS — R202 Paresthesia of skin: Secondary | ICD-10-CM | POA: Insufficient documentation

## 2024-05-06 LAB — URINALYSIS, ROUTINE W REFLEX MICROSCOPIC
Bilirubin Urine: NEGATIVE
Glucose, UA: NEGATIVE mg/dL
Hgb urine dipstick: NEGATIVE
Ketones, ur: NEGATIVE mg/dL
Leukocytes,Ua: NEGATIVE
Nitrite: NEGATIVE
Protein, ur: NEGATIVE mg/dL
Specific Gravity, Urine: <=1.005 (ref 1.005–1.030)
pH: 6 (ref 5.0–8.0)

## 2024-05-06 LAB — COMPREHENSIVE METABOLIC PANEL WITH GFR
ALT: 57 U/L — ABNORMAL HIGH (ref 0–44)
AST: 64 U/L — ABNORMAL HIGH (ref 15–41)
Albumin: 4.1 g/dL (ref 3.5–5.0)
Alkaline Phosphatase: 117 U/L (ref 38–126)
Anion gap: 13 (ref 5–15)
BUN: <5 mg/dL — ABNORMAL LOW (ref 6–20)
CO2: 19 mmol/L — ABNORMAL LOW (ref 22–32)
Calcium: 8.6 mg/dL — ABNORMAL LOW (ref 8.9–10.3)
Chloride: 107 mmol/L (ref 98–111)
Creatinine, Ser: 0.63 mg/dL (ref 0.44–1.00)
GFR, Estimated: >60 mL/min (ref >60)
Glucose, Bld: 79 mg/dL (ref 70–99)
Potassium: 3 mmol/L — ABNORMAL LOW (ref 3.5–5.1)
Sodium: 138 mmol/L (ref 135–145)
Total Bilirubin: <0.2 mg/dL (ref 0.0–1.2)
Total Protein: 6.7 g/dL (ref 6.5–8.1)

## 2024-05-06 LAB — CBC WITH DIFFERENTIAL/PLATELET
Abs Immature Granulocytes: 0.02 10*3/uL (ref 0.00–0.07)
Basophils Absolute: 0 10*3/uL (ref 0.0–0.1)
Basophils Relative: 1 %
Eosinophils Absolute: 0.1 10*3/uL (ref 0.0–0.5)
Eosinophils Relative: 2 %
HCT: 38 % (ref 36.0–46.0)
Hemoglobin: 12.7 g/dL (ref 12.0–15.0)
Immature Granulocytes: 0 %
Lymphocytes Relative: 24 %
Lymphs Abs: 1.3 10*3/uL (ref 0.7–4.0)
MCH: 33.8 pg (ref 26.0–34.0)
MCHC: 33.4 g/dL (ref 30.0–36.0)
MCV: 101.1 fL — ABNORMAL HIGH (ref 80.0–100.0)
Monocytes Absolute: 0.7 10*3/uL (ref 0.1–1.0)
Monocytes Relative: 13 %
Neutro Abs: 3.4 10*3/uL (ref 1.7–7.7)
Neutrophils Relative %: 60 %
Platelets: 252 10*3/uL (ref 150–400)
RBC: 3.76 MIL/uL — ABNORMAL LOW (ref 3.87–5.11)
RDW: 13.2 % (ref 11.5–15.5)
WBC: 5.5 10*3/uL (ref 4.0–10.5)
nRBC: 0 % (ref 0.0–0.2)

## 2024-05-06 MED ORDER — POTASSIUM CHLORIDE CRYS ER 20 MEQ PO TBCR
40.0000 meq | EXTENDED_RELEASE_TABLET | Freq: Once | ORAL | Status: AC
  Administered 2024-05-06: 40 meq via ORAL
  Filled 2024-05-06: qty 2

## 2024-05-06 MED ORDER — POTASSIUM CHLORIDE CRYS ER 20 MEQ PO TBCR
20.0000 meq | EXTENDED_RELEASE_TABLET | Freq: Two times a day (BID) | ORAL | 0 refills | Status: DC
Start: 2024-05-06 — End: 2024-06-30

## 2024-05-06 NOTE — Discharge Instructions (Addendum)
 Your evaluated in the emergency room for lower extremity numbness.  You are noted to have a mildly low potassium.  Prescription for potassium supplementation was sent into your pharmacy.  Please follow with your primary care doctor to recheck your levels.  Please additionally call the number on the sheet to follow-up with orthopedics.  If you experience any new or worsening symptoms including urinary or fecal incontinence, numbness or tingling in your groin please return the emergency room.

## 2024-05-06 NOTE — ED Triage Notes (Signed)
 Pt via POV reporting that her right foot has been aslepp since 0130 Tuesday morning. She has some pain along the back of her calf that started yesterday and has worsened throughout the day, spreading up to her knee and thigh. She describes it as "pressure" rated 3/10 constant. Pt ambulatory in NAD. No swelling or redness noted. No prior hx of same, no blood clots, no back injuries.

## 2024-05-06 NOTE — ED Provider Notes (Addendum)
 [place] [facility] Provider Note   CSN: [id number] Arrival date & time: 05/06/24  1725     History  Chief Complaint  Patient presents with   Numbness    Nicole Mccoy is a 43 y.o. female with history of fibromyalgia, Raynaud's and recent admission for GI E. coli presents with complaints of right lower extremity numbness and tingling.  Patient states she woke up Tuesday morning and her foot was asleep.  There was no injury or trauma.  She states at that time she had an episode of fecal incontinence.  She has not had this since.  She notes that in the days prior she did have an episode of hematuria.  She has had some nausea, vomiting diarrhea.  She has no history of blood clots, no recent surgery or travel, she is not on birth control.  She denies any chest pain or shortness of breath.  She has no history of back issues or surgeries.  Although she is endorsing some pain over her left flank.  She is not having any dysuria or increased frequency.  HPI    Past Medical History:  Diagnosis Date   Fibromyalgia    Migraine    Pneumonia    Raynaud's syndrome    Past Surgical History:  Procedure Laterality Date   SHOULDER SURGERY       Home Medications Prior to Admission medications   Medication Sig Start Date End Date Taking? Authorizing Provider  potassium chloride SA (KLOR-CON M) 20 MEQ tablet Take 1 tablet (20 mEq total) by mouth 2 (two) times daily. 05/06/24  Yes Felicie Horning, PA-C  botulinum toxin Type A (BOTOX) 200 units injection Inject 200 Units into the muscle every 3 (three) months. 03/05/24   [provider]  Citalopram  Hydrobromide (CELEXA  PO) Take 40 mg by mouth at bedtime.    [provider]  clonazePAM  (KLONOPIN ) 1 MG tablet Take 1 mg by mouth 2 (two) times daily as needed for anxiety.    [provider]  cyclobenzaprine  (FLEXERIL ) 10 MG tablet Take 10 mg by mouth 3 (three) times daily as needed for  muscle spasms.    [provider]  gabapentin  (NEURONTIN ) 300 MG capsule Take 600 mg by mouth 2 (two) times daily. 03/23/18   [provider]  loperamide  (IMODIUM ) 2 MG capsule Take 1 capsule (2 mg total) by mouth every 6 (six) hours as needed for diarrhea or loose stools. 04/01/24   Hoyt Macleod, MD  methylphenidate 27 MG PO CR tablet Take 27 mg by mouth daily. 03/11/24 04/10/24  [provider]  promethazine  (PHENERGAN ) 12.5 MG tablet Take 1 tablet (12.5 mg total) by mouth every 6 (six) hours as needed for nausea or vomiting. 04/01/24   Hoyt Macleod, MD  Topiramate  (TOPAMAX  PO) Take 200 mg by mouth daily.    [provider]  traMADol  (ULTRAM ) 50 MG tablet Take 50 mg by mouth every 6 (six) hours as needed for moderate pain (pain score 4-6).    [provider]      Allergies    Sulfa antibiotics, Ibuprofen , and Lorazepam     Review of Systems   Review of Systems  Neurological:  Positive for numbness.    Physical Exam Updated Vital Signs BP (!) 120/95   Pulse (!) 112   Temp 98.1 F (36.7 C) (Oral)   Resp 16   Ht 5\' 4"  (1.626 m)   Wt 65.8 kg   LMP 05/03/2024 (  Exact Date)   SpO2 100%   BMI 24.89 kg/m  Physical Exam Vitals and nursing note reviewed.  Constitutional:      General: She is not in acute distress.    Appearance: She is well-developed.  HENT:     Head: Normocephalic and atraumatic.  Eyes:     Conjunctiva/sclera: Conjunctivae normal.  Cardiovascular:     Rate and Rhythm: Normal rate and regular rhythm.     Heart sounds: No murmur heard. Pulmonary:     Effort: Pulmonary effort is normal. No respiratory distress.     Breath sounds: Normal breath sounds.  Abdominal:     Palpations: Abdomen is soft.     Tenderness: There is no abdominal tenderness. There is left CVA tenderness.  Musculoskeletal:        General: No swelling.     Cervical back: Neck supple.     Comments: DP pulses 2+ bilaterally, no swelling or tenderness to  lower extremities, tolerates full range of motion without discomfort, no spinal midline tenderness  Skin:    General: Skin is warm and dry.     Capillary Refill: Capillary refill takes less than 2 seconds.  Neurological:     Mental Status: She is alert.  Psychiatric:        Mood and Affect: Mood normal.     ED Results / Procedures / Treatments   Labs (all labs ordered are listed, but only abnormal results are displayed) Labs Reviewed  CBC WITH DIFFERENTIAL/PLATELET - Abnormal; Notable for the following components:      Result Value   RBC 3.76 (*)    MCV 101.1 (*)    All other components within normal limits  COMPREHENSIVE METABOLIC PANEL WITH GFR - Abnormal; Notable for the following components:   Potassium 3.0 (*)    CO2 19 (*)    BUN <5 (*)    Calcium 8.6 (*)    AST 64 (*)    ALT 57 (*)    All other components within normal limits  URINALYSIS, ROUTINE W REFLEX MICROSCOPIC    EKG None  Radiology US  Venous Img Lower Right (DVT Study) Result Date: 05/06/2024 CLINICAL DATA:  Right leg numbness and pain EXAM: RIGHT LOWER EXTREMITY VENOUS DOPPLER ULTRASOUND TECHNIQUE: Gray-scale sonography with compression, as well as color and duplex ultrasound, were performed to evaluate the deep venous system(s) from the level of the common femoral vein through the popliteal and proximal calf veins. COMPARISON:  None Available. FINDINGS: VENOUS Normal compressibility of the common femoral, superficial femoral, and popliteal veins, as well as the visualized calf veins. Visualized portions of profunda femoral vein and great saphenous vein unremarkable. No filling defects to suggest DVT on grayscale or color Doppler imaging. Doppler waveforms show normal direction of venous flow, normal respiratory plasticity and response to augmentation. Limited views of the contralateral common femoral vein are unremarkable. OTHER None. Limitations: none IMPRESSION: Negative. Electronically Signed   By: Limin  Xu  M.D.   On: 05/06/2024 18:53    Procedures Procedures    Medications Ordered in ED Medications  potassium chloride SA (KLOR-CON M) CR tablet 40 mEq (40 mEq Oral Given 05/06/24 1937)    ED Course/ Medical Decision Making/ A&P                                 Medical Decision Making Amount and/or Complexity of Data Reviewed Labs: ordered.  Risk Prescription drug management.  This patient presents to the ED with chief complaint(s) of numbness.  The complaint involves an extensive differential diagnosis and also carries with it a high risk of complications and morbidity.   Pertinent past medical history as listed in HPI  The differential diagnosis includes  CVA, TIA, cauda equina, spinal stenosis/sciatica, DVT Additional history obtained: Records reviewed Care Everywhere/External Records  Assessment and management:   Patient presents tachycardic to 120 with complaints of right lower extremity numbness and tingling.  Symptoms started this past Tuesday early in the morning upon waking.  Reports that she did have an episode of fecal incontinence at that time.  No IV drug use.  She denies any saddle anesthesia.  No midline spinal tenderness.  There was no injury or trauma.  She has no history of back issues or surgeries.  Other than recent weeklong hospital admission she has no risk factors for DVT or PE.  She has no chest pain or shortness of breath.  She has no lower extremity swelling or tenderness, her compartments are soft, DP pulses 2+.  She has no upper extremity symptoms, no headache, vision changes or dizziness.  She does report that the day before the symptoms started she had an episode of hematuria and had some nausea, vomiting diarrhea.  The nausea has persisted.  She has no abdominal pain.  Her abdomen is nontender on exam.  She does have positive left CVAT.  Patient noted to have mild hypokalemia with an improving transaminitis.  Otherwise lab work is overall reassuring.  DVT  study is negative.  Patient does have good rectal tone.  And it has been several days since episode of fecal incontinence.  She has no urinary incontinence and no saddle anesthesia. She has no midline tenderness.  Do not feel that emergent MRI is indicated at this time.  Patient will be referred to Ortho for further evaluation of her right lower extremity numbness and tingling seems to be localized more so to her ankle and feet.  Independent ECG interpretation:  Sinus tachycardia  Independent labs interpretation:  The following labs were independently interpreted:  CMP with mild hypokalemia of 3, transaminitis that seems to be improving AST of 64 and ALT of 57, CBC without significant abnormality, UA unremarkable  Independent visualization and interpretation of imaging: I independently visualized the following imaging with scope of interpretation limited to determining acute life threatening conditions related to emergency care:  Right lower extremity venous Doppler negative for DVT   Consultations obtained:   none  Disposition:   Patient will be discharged home. The patient has been appropriately medically screened and/or stabilized in the ED. I have low suspicion for any other emergent medical condition which would require further screening, evaluation or treatment in the ED or require inpatient management. At time of discharge the patient is hemodynamically stable and in no acute distress. I have discussed work-up results and diagnosis with patient and answered all questions. Patient is agreeable with discharge plan. We discussed strict return precautions for returning to the emergency department and they verbalized understanding.     Social Determinants of Health:   none  This note was dictated with voice recognition software.  Despite best efforts at proofreading, errors may have occurred which can change the documentation meaning.          Final Clinical Impression(s) / ED  Diagnoses Final diagnoses:  Paresthesia  Hypokalemia    Rx / DC Orders ED Discharge Orders  Ordered    potassium chloride SA (KLOR-CON M) 20 MEQ tablet  2 times daily        05/06/24 2002              Stanton Earthly 05/06/24 2004    Taliah Porche H, PA-C 05/06/24 Roanne Chi, MD 05/06/24 2153

## 2024-06-29 ENCOUNTER — Observation Stay (HOSPITAL_BASED_OUTPATIENT_CLINIC_OR_DEPARTMENT_OTHER)
Admission: EM | Admit: 2024-06-29 | Discharge: 2024-06-30 | Disposition: A | Discharge status: Home / Self Care | Payer: Self-pay | Attending: Internal Medicine | Admitting: Internal Medicine

## 2024-06-29 ENCOUNTER — Other Ambulatory Visit: Payer: Self-pay

## 2024-06-29 ENCOUNTER — Encounter (HOSPITAL_BASED_OUTPATIENT_CLINIC_OR_DEPARTMENT_OTHER): Payer: Self-pay

## 2024-06-29 ENCOUNTER — Emergency Department (HOSPITAL_BASED_OUTPATIENT_CLINIC_OR_DEPARTMENT_OTHER): Payer: Self-pay

## 2024-06-29 DIAGNOSIS — A09 Infectious gastroenteritis and colitis, unspecified: Secondary | ICD-10-CM

## 2024-06-29 DIAGNOSIS — R000 Tachycardia, unspecified: Secondary | ICD-10-CM | POA: Insufficient documentation

## 2024-06-29 DIAGNOSIS — R112 Nausea with vomiting, unspecified: Secondary | ICD-10-CM

## 2024-06-29 DIAGNOSIS — E860 Dehydration: Principal | ICD-10-CM | POA: Insufficient documentation

## 2024-06-29 DIAGNOSIS — D72829 Elevated white blood cell count, unspecified: Secondary | ICD-10-CM | POA: Insufficient documentation

## 2024-06-29 DIAGNOSIS — E876 Hypokalemia: Secondary | ICD-10-CM | POA: Insufficient documentation

## 2024-06-29 DIAGNOSIS — K529 Noninfective gastroenteritis and colitis, unspecified: Secondary | ICD-10-CM | POA: Insufficient documentation

## 2024-06-29 DIAGNOSIS — F411 Generalized anxiety disorder: Secondary | ICD-10-CM | POA: Insufficient documentation

## 2024-06-29 DIAGNOSIS — E8729 Other acidosis: Secondary | ICD-10-CM | POA: Insufficient documentation

## 2024-06-29 DIAGNOSIS — M797 Fibromyalgia: Secondary | ICD-10-CM | POA: Insufficient documentation

## 2024-06-29 DIAGNOSIS — T730XXA Starvation, initial encounter: Principal | ICD-10-CM | POA: Insufficient documentation

## 2024-06-29 LAB — CBC WITH DIFFERENTIAL/PLATELET
Abs Immature Granulocytes: 0.06 K/uL (ref 0.00–0.07)
Basophils Absolute: 0 K/uL (ref 0.0–0.1)
Basophils Relative: 0 %
Eosinophils Absolute: 0 K/uL (ref 0.0–0.5)
Eosinophils Relative: 0 %
HCT: 46 % (ref 36.0–46.0)
Hemoglobin: 15.2 g/dL — ABNORMAL HIGH (ref 12.0–15.0)
Immature Granulocytes: 0 %
Lymphocytes Relative: 5 %
Lymphs Abs: 0.8 K/uL (ref 0.7–4.0)
MCH: 33.9 pg (ref 26.0–34.0)
MCHC: 33 g/dL (ref 30.0–36.0)
MCV: 102.7 fL — ABNORMAL HIGH (ref 80.0–100.0)
Monocytes Absolute: 1.4 K/uL — ABNORMAL HIGH (ref 0.1–1.0)
Monocytes Relative: 9 %
Neutro Abs: 13.9 K/uL — ABNORMAL HIGH (ref 1.7–7.7)
Neutrophils Relative %: 86 %
Platelets: 348 K/uL (ref 150–400)
RBC: 4.48 MIL/uL (ref 3.87–5.11)
RDW: 14 % (ref 11.5–15.5)
WBC: 16.3 K/uL — ABNORMAL HIGH (ref 4.0–10.5)
nRBC: 0 % (ref 0.0–0.2)

## 2024-06-29 LAB — BASIC METABOLIC PANEL WITH GFR
Anion gap: 24 — ABNORMAL HIGH (ref 5–15)
Anion gap: 19 — ABNORMAL HIGH (ref 5–15)
BUN: 9 mg/dL (ref 6–20)
BUN: 6 mg/dL (ref 6–20)
CO2: 10 mmol/L — ABNORMAL LOW (ref 22–32)
CO2: 17 mmol/L — ABNORMAL LOW (ref 22–32)
Calcium: 7.8 mg/dL — ABNORMAL LOW (ref 8.9–10.3)
Calcium: 8.6 mg/dL — ABNORMAL LOW (ref 8.9–10.3)
Chloride: 98 mmol/L (ref 98–111)
Chloride: 100 mmol/L (ref 98–111)
Creatinine, Ser: 0.66 mg/dL (ref 0.44–1.00)
Creatinine, Ser: 0.66 mg/dL (ref 0.44–1.00)
GFR, Estimated: >60 mL/min (ref >60)
GFR, Estimated: >60 mL/min (ref >60)
Glucose, Bld: 73 mg/dL (ref 70–99)
Glucose, Bld: 115 mg/dL — ABNORMAL HIGH (ref 70–99)
Potassium: 5.1 mmol/L (ref 3.5–5.1)
Potassium: 4.1 mmol/L (ref 3.5–5.1)
Sodium: 132 mmol/L — ABNORMAL LOW (ref 135–145)
Sodium: 135 mmol/L (ref 135–145)

## 2024-06-29 LAB — URINE DRUG SCREEN
Amphetamines: NOT DETECTED
Barbiturates: NOT DETECTED
Benzodiazepines: NOT DETECTED
Cocaine: NOT DETECTED
Fentanyl: NOT DETECTED
Methadone Scn, Ur: NOT DETECTED
Opiates: NOT DETECTED
Tetrahydrocannabinol: NOT DETECTED

## 2024-06-29 LAB — I-STAT VENOUS BLOOD GAS, ED
Acid-base deficit: 17 mmol/L — ABNORMAL HIGH (ref 0.0–2.0)
Acid-base deficit: 14 mmol/L — ABNORMAL HIGH (ref 0.0–2.0)
Bicarbonate: 11.2 mmol/L — ABNORMAL LOW (ref 20.0–28.0)
Bicarbonate: 10.2 mmol/L — ABNORMAL LOW (ref 20.0–28.0)
Calcium, Ion: 1.08 mmol/L — ABNORMAL LOW (ref 1.15–1.40)
Calcium, Ion: 0.94 mmol/L — ABNORMAL LOW (ref 1.15–1.40)
HCT: 43 % (ref 36.0–46.0)
HCT: 40 % (ref 36.0–46.0)
Hemoglobin: 14.6 g/dL (ref 12.0–15.0)
Hemoglobin: 13.6 g/dL (ref 12.0–15.0)
O2 Saturation: 41 %
O2 Saturation: 58 %
Potassium: 4.9 mmol/L (ref 3.5–5.1)
Potassium: 6.9 mmol/L (ref 3.5–5.1)
Sodium: 133 mmol/L — ABNORMAL LOW (ref 135–145)
Sodium: 130 mmol/L — ABNORMAL LOW (ref 135–145)
TCO2: 12 mmol/L — ABNORMAL LOW (ref 22–32)
TCO2: 11 mmol/L — ABNORMAL LOW (ref 22–32)
pCO2, Ven: 32.3 mmHg — ABNORMAL LOW (ref 44–60)
pCO2, Ven: 21.1 mmHg — ABNORMAL LOW (ref 44–60)
pH, Ven: 7.147 — CL (ref 7.25–7.43)
pH, Ven: 7.293 (ref 7.25–7.43)
pO2, Ven: 30 mmHg — CL (ref 32–45)
pO2, Ven: 33 mmHg (ref 32–45)

## 2024-06-29 LAB — COMPREHENSIVE METABOLIC PANEL WITH GFR
ALT: 88 U/L — ABNORMAL HIGH (ref 0–44)
AST: 141 U/L — ABNORMAL HIGH (ref 15–41)
Albumin: 4.5 g/dL (ref 3.5–5.0)
Alkaline Phosphatase: 171 U/L — ABNORMAL HIGH (ref 38–126)
Anion gap: 31 — ABNORMAL HIGH (ref 5–15)
BUN: 13 mg/dL (ref 6–20)
CO2: 10 mmol/L — ABNORMAL LOW (ref 22–32)
Calcium: 8.7 mg/dL — ABNORMAL LOW (ref 8.9–10.3)
Chloride: 95 mmol/L — ABNORMAL LOW (ref 98–111)
Creatinine, Ser: 0.75 mg/dL (ref 0.44–1.00)
GFR, Estimated: >60 mL/min (ref >60)
Glucose, Bld: 66 mg/dL — ABNORMAL LOW (ref 70–99)
Potassium: 4.9 mmol/L (ref 3.5–5.1)
Sodium: 136 mmol/L (ref 135–145)
Total Bilirubin: 0.3 mg/dL (ref 0.0–1.2)
Total Protein: 7.5 g/dL (ref 6.5–8.1)

## 2024-06-29 LAB — CBC
HCT: 37.5 % (ref 36.0–46.0)
Hemoglobin: 12.9 g/dL (ref 12.0–15.0)
MCH: 34.2 pg — ABNORMAL HIGH (ref 26.0–34.0)
MCHC: 34.4 g/dL (ref 30.0–36.0)
MCV: 99.5 fL (ref 80.0–100.0)
Platelets: 265 K/uL (ref 150–400)
RBC: 3.77 MIL/uL — ABNORMAL LOW (ref 3.87–5.11)
RDW: 14 % (ref 11.5–15.5)
WBC: 9.7 K/uL (ref 4.0–10.5)
nRBC: 0 % (ref 0.0–0.2)

## 2024-06-29 LAB — ETHANOL: Alcohol, Ethyl (B): <15 mg/dL (ref <15)

## 2024-06-29 LAB — RESP PANEL BY RT-PCR (RSV, FLU A&B, COVID)  RVPGX2
Influenza A by PCR: NEGATIVE
Influenza B by PCR: NEGATIVE
Resp Syncytial Virus by PCR: NEGATIVE
SARS Coronavirus 2 by RT PCR: NEGATIVE

## 2024-06-29 LAB — GROUP A STREP BY PCR: Group A Strep by PCR: NOT DETECTED

## 2024-06-29 LAB — URINALYSIS, ROUTINE W REFLEX MICROSCOPIC
Bilirubin Urine: NEGATIVE
Glucose, UA: NEGATIVE mg/dL
Ketones, ur: >=80 mg/dL — AB
Leukocytes,Ua: NEGATIVE
Nitrite: NEGATIVE
Protein, ur: NEGATIVE mg/dL
Specific Gravity, Urine: >=1.03 (ref 1.005–1.030)
pH: 5.5 (ref 5.0–8.0)

## 2024-06-29 LAB — CBG MONITORING, ED: Glucose-Capillary: 88 mg/dL (ref 70–99)

## 2024-06-29 LAB — HCG, SERUM, QUALITATIVE: Preg, Serum: NEGATIVE

## 2024-06-29 LAB — URINALYSIS, MICROSCOPIC (REFLEX)

## 2024-06-29 LAB — LIPASE, BLOOD: Lipase: 14 U/L (ref 11–51)

## 2024-06-29 MED ORDER — ONDANSETRON HCL 4 MG/2ML IJ SOLN
4.0000 mg | Freq: Once | INTRAMUSCULAR | Status: AC
  Administered 2024-06-29: 4 mg via INTRAVENOUS
  Filled 2024-06-29: qty 2

## 2024-06-29 MED ORDER — THIAMINE HCL 100 MG/ML IJ SOLN: 100.0000 mg | Freq: Every day | INTRAMUSCULAR | Status: DC

## 2024-06-29 MED ORDER — LACTATED RINGERS IV BOLUS
1000.0000 mL | Freq: Once | INTRAVENOUS | Status: AC
  Administered 2024-06-29: 1000 mL via INTRAVENOUS

## 2024-06-29 MED ORDER — DIAZEPAM 5 MG/ML IJ SOLN
5.0000 mg | Freq: Once | INTRAMUSCULAR | Status: AC
  Administered 2024-06-29: 5 mg via INTRAVENOUS
  Filled 2024-06-29: qty 2

## 2024-06-29 MED ORDER — PROCHLORPERAZINE EDISYLATE 10 MG/2ML IJ SOLN
10.0000 mg | Freq: Once | INTRAMUSCULAR | Status: AC
  Administered 2024-06-29: 10 mg via INTRAVENOUS
  Filled 2024-06-29: qty 2

## 2024-06-29 MED ORDER — SODIUM CHLORIDE 0.9 % IV BOLUS
1000.0000 mL | Freq: Once | INTRAVENOUS | Status: AC
  Administered 2024-06-29: 1000 mL via INTRAVENOUS

## 2024-06-29 MED ORDER — ADULT MULTIVITAMIN W/MINERALS CH
1.0000 | ORAL_TABLET | Freq: Every day | ORAL | Status: DC
  Administered 2024-06-29 – 2024-06-30 (×2): 1 via ORAL
  Filled 2024-06-29 (×2): qty 1

## 2024-06-29 MED ORDER — KETOROLAC TROMETHAMINE 15 MG/ML IJ SOLN
15.0000 mg | Freq: Once | INTRAMUSCULAR | Status: AC
  Administered 2024-06-29: 15 mg via INTRAVENOUS
  Filled 2024-06-29: qty 1

## 2024-06-29 MED ORDER — IOHEXOL 300 MG/ML  SOLN
100.0000 mL | Freq: Once | INTRAMUSCULAR | Status: AC | PRN
  Administered 2024-06-29: 100 mL via INTRAVENOUS

## 2024-06-29 MED ORDER — LORAZEPAM 2 MG/ML IJ SOLN: 1.0000 mg | INTRAMUSCULAR | Status: DC | PRN

## 2024-06-29 MED ORDER — LIDOCAINE VISCOUS HCL 2 % MT SOLN
15.0000 mL | Freq: Once | OROMUCOSAL | Status: AC
  Administered 2024-06-29: 15 mL via ORAL
  Filled 2024-06-29: qty 15

## 2024-06-29 MED ORDER — SODIUM BICARBONATE 8.4 % IV SOLN
INTRAVENOUS | Status: AC
  Filled 2024-06-29: qty 50

## 2024-06-29 MED ORDER — FOLIC ACID 1 MG PO TABS
1.0000 mg | ORAL_TABLET | Freq: Every day | ORAL | Status: DC
  Administered 2024-06-29 – 2024-06-30 (×2): 1 mg via ORAL
  Filled 2024-06-29 (×2): qty 1

## 2024-06-29 MED ORDER — LORAZEPAM 1 MG PO TABS: 1.0000 mg | ORAL_TABLET | ORAL | Status: DC | PRN

## 2024-06-29 MED ORDER — SODIUM BICARBONATE 8.4 % IV SOLN
INTRAVENOUS | Status: DC
  Filled 2024-06-29: qty 150
  Filled 2024-06-29 (×3): qty 1000

## 2024-06-29 MED ORDER — DEXTROSE 50 % IV SOLN
12.5000 g | Freq: Once | INTRAVENOUS | Status: AC
  Administered 2024-06-29: 12.5 g via INTRAVENOUS
  Filled 2024-06-29: qty 50

## 2024-06-29 MED ORDER — PANTOPRAZOLE SODIUM 40 MG IV SOLR
40.0000 mg | Freq: Once | INTRAVENOUS | Status: AC
  Administered 2024-06-29: 40 mg via INTRAVENOUS
  Filled 2024-06-29: qty 10

## 2024-06-29 MED ORDER — THIAMINE MONONITRATE 100 MG PO TABS
100.0000 mg | ORAL_TABLET | Freq: Every day | ORAL | Status: DC
  Administered 2024-06-29 – 2024-06-30 (×2): 100 mg via ORAL
  Filled 2024-06-29 (×2): qty 1

## 2024-06-29 MED ORDER — ENOXAPARIN SODIUM 40 MG/0.4ML IJ SOSY
40.0000 mg | PREFILLED_SYRINGE | INTRAMUSCULAR | Status: DC
  Administered 2024-06-29: 40 mg via SUBCUTANEOUS
  Filled 2024-06-29: qty 0.4

## 2024-06-29 MED ORDER — ALUM & MAG HYDROXIDE-SIMETH 200-200-20 MG/5ML PO SUSP
30.0000 mL | Freq: Once | ORAL | Status: AC
  Administered 2024-06-29: 30 mL via ORAL
  Filled 2024-06-29 (×2): qty 30

## 2024-06-29 NOTE — ED Notes (Signed)
 Pt given gingerale and crackers

## 2024-06-29 NOTE — ED Notes (Signed)
 Patient continues to remove monitoring equipment even after being told why she needs to be monitored.

## 2024-06-29 NOTE — ED Notes (Signed)
 Panic Value on VBG given to PA.

## 2024-06-29 NOTE — Progress Notes (Signed)
   06/29/24 2248  Assess: MEWS Score  Temp 98.9 F (37.2 C)  BP (!) 144/96  MAP (mmHg) 112  Pulse Rate (!) 115  Resp 18  SpO2 100 %  O2 Device Room Air  Assess: MEWS Score  MEWS Temp 0  MEWS Systolic 0  MEWS Pulse 2  MEWS RR 0  MEWS LOC 0  MEWS Score 2  MEWS Score Color Yellow  Assess: if the MEWS score is Yellow or Red  Were vital signs accurate and taken at a resting state? Yes  Does the patient meet 2 or more of the SIRS criteria? No  MEWS guidelines implemented  Yes, yellow  Treat  MEWS Interventions Considered administering scheduled or prn medications/treatments as ordered  Take Vital Signs  Increase Vital Sign Frequency  Yellow: Q2hr x1, continue Q4hrs until patient remains green for 12hrs  Escalate  MEWS: Escalate Yellow: Discuss with charge nurse and consider notifying provider and/or RRT  Notify: Charge Nurse/RN  Name of Charge Nurse/RN Notified Nurse, children's  Assess: SIRS CRITERIA  SIRS Temperature  0  SIRS Respirations  0  SIRS Pulse 1  SIRS WBC 0  SIRS Score Sum  1

## 2024-06-29 NOTE — ED Notes (Signed)
 Report to [facility] nurse for [phone]

## 2024-06-29 NOTE — ED Provider Notes (Signed)
 [facility] Provider Note   CSN: [id number] Arrival date & time: 06/29/24  9056     Patient presents with: Diarrhea and Emesis   Nicole Mccoy is a 43 y.o. female with past medical history presents with concern for nausea and vomiting for the past 4 days.  Reports that emesis is nonbilious, nonbloody.  She reports watery nonbloody diarrhea as well. She is unable to quantify how many episodes of diarrhea and vomiting she is having per day.  She reports some mild abdominal pain.  Also reports pain in her throat.  She denies any fever or chills at home.  Denies any recent travel or abnormal food intake. She reports a co-worker was recently sick with a stomach bug last week.  Denies any recent antibiotic use. States she was prescribed an antibiotic for a right ear infection a couple days ago, but never started this antibiotic.  She reports her ear pain has resolved.  She reports drinking about 2 or 4 glasses of wine daily.  Denies every having withdrawal symptoms.    Diarrhea Associated symptoms: vomiting   Emesis Associated symptoms: diarrhea        Prior to Admission medications   Medication Sig Start Date End Date Taking? Authorizing Provider  botulinum toxin Type A (BOTOX) 200 units injection Inject 200 Units into the muscle every 3 (three) months. 03/05/24   [provider]  Citalopram  Hydrobromide (CELEXA  PO) Take 40 mg by mouth at bedtime.    [provider]  clonazePAM  (KLONOPIN ) 1 MG tablet Take 1 mg by mouth 2 (two) times daily as needed for anxiety.    [provider]  cyclobenzaprine  (FLEXERIL ) 10 MG tablet Take 10 mg by mouth 3 (three) times daily as needed for muscle spasms.    [provider]  gabapentin  (NEURONTIN ) 300 MG capsule Take 600 mg by mouth 2 (two) times daily. 03/23/18   [provider]  loperamide  (IMODIUM ) 2 MG capsule Take 1 capsule (2 mg total) by mouth every 6 (six)  hours as needed for diarrhea or loose stools. 04/01/24   Arlice Reichert, MD  methylphenidate 27 MG PO CR tablet Take 27 mg by mouth daily. 03/11/24 04/10/24  [provider]  potassium chloride  SA (KLOR-CON  M) 20 MEQ tablet Take 1 tablet (20 mEq total) by mouth 2 (two) times daily. 05/06/24   Donnajean Lynwood DEL, PA-C  promethazine  (PHENERGAN ) 12.5 MG tablet Take 1 tablet (12.5 mg total) by mouth every 6 (six) hours as needed for nausea or vomiting. 04/01/24   Arlice Reichert, MD  Topiramate  (TOPAMAX  PO) Take 200 mg by mouth daily.    [provider]  traMADol  (ULTRAM ) 50 MG tablet Take 50 mg by mouth every 6 (six) hours as needed for moderate pain (pain score 4-6).    [provider]    Allergies: Sulfa antibiotics, Ibuprofen , and Lorazepam     Review of Systems  Gastrointestinal:  Positive for diarrhea and vomiting.    Updated Vital Signs BP 125/86   Pulse (!) 132   Temp 99.6 F (37.6 C) (Tympanic)   Resp 19   LMP 06/28/2024   SpO2 100%   Physical Exam Vitals and nursing note reviewed.  Constitutional:      General: She is not in acute distress.    Appearance: She is well-developed.  HENT:     Head: Normocephalic and atraumatic.     Right Ear: Tympanic membrane and ear canal normal.  Left Ear: Tympanic membrane and ear canal normal.     Mouth/Throat:     Mouth: Mucous membranes are dry.     Pharynx: Posterior oropharyngeal erythema present. No oropharyngeal exudate.  Eyes:     Extraocular Movements: Extraocular movements intact.     Conjunctiva/sclera: Conjunctivae normal.     Pupils: Pupils are equal, round, and reactive to light.  Cardiovascular:     Rate and Rhythm: Regular rhythm. Tachycardia present.     Heart sounds: No murmur heard. Pulmonary:     Effort: Pulmonary effort is normal. No respiratory distress.     Breath sounds: Normal breath sounds.  Abdominal:     Palpations: Abdomen is soft.     Tenderness: There is no abdominal tenderness.      Comments: Abdomen soft and non-tender without rebound or gaurding  Musculoskeletal:        General: No swelling.     Cervical back: Neck supple.  Skin:    General: Skin is warm and dry.     Capillary Refill: Capillary refill takes less than 2 seconds.  Neurological:     Mental Status: She is alert.  Psychiatric:        Mood and Affect: Mood normal.     (all labs ordered are listed, but only abnormal results are displayed) Labs Reviewed  CBC WITH DIFFERENTIAL/PLATELET - Abnormal; Notable for the following components:      Result Value   WBC 16.3 (*)    Hemoglobin 15.2 (*)    MCV 102.7 (*)    Neutro Abs 13.9 (*)    Monocytes Absolute 1.4 (*)    All other components within normal limits  COMPREHENSIVE METABOLIC PANEL WITH GFR - Abnormal; Notable for the following components:   Chloride 95 (*)    CO2 10 (*)    Glucose, Bld 66 (*)    Calcium 8.7 (*)    AST 141 (*)    ALT 88 (*)    Alkaline Phosphatase 171 (*)    Anion gap 31 (*)    All other components within normal limits  URINALYSIS, ROUTINE W REFLEX MICROSCOPIC - Abnormal; Notable for the following components:   Hgb urine dipstick TRACE (*)    Ketones, ur >=80 (*)    All other components within normal limits  URINALYSIS, MICROSCOPIC (REFLEX) - Abnormal; Notable for the following components:   Bacteria, UA RARE (*)    All other components within normal limits  BASIC METABOLIC PANEL WITH GFR - Abnormal; Notable for the following components:   Sodium 132 (*)    CO2 10 (*)    Calcium 7.8 (*)    Anion gap 24 (*)    All other components within normal limits  I-STAT VENOUS BLOOD GAS, ED - Abnormal; Notable for the following components:   pH, Ven 7.147 (*)    pCO2, Ven 32.3 (*)    pO2, Ven 30 (*)    Bicarbonate 11.2 (*)    TCO2 12 (*)    Acid-base deficit 17.0 (*)    Sodium 133 (*)    Calcium, Ion 1.08 (*)    All other components within normal limits  I-STAT VENOUS BLOOD GAS, ED - Abnormal; Notable for the following  components:   pCO2, Ven 21.1 (*)    Bicarbonate 10.2 (*)    TCO2 11 (*)    Acid-base deficit 14.0 (*)    Sodium 130 (*)    Potassium 6.9 (*)    Calcium, Ion 0.94 (*)  All other components within normal limits  RESP PANEL BY RT-PCR (RSV, FLU A&B, COVID)  [id number]  GROUP A STREP BY PCR  GASTROINTESTINAL PANEL BY PCR, STOOL (REPLACES STOOL CULTURE)  LIPASE, BLOOD  HCG, SERUM, QUALITATIVE  URINE DRUG SCREEN  ETHANOL  BASIC METABOLIC PANEL WITH GFR  CBC  CBG MONITORING, ED    EKG: EKG Interpretation Date/Time:  Tuesday June 29 2024 14:56:58 EDT Ventricular Rate:  130 PR Interval:  74 QRS Duration:  78 QT Interval:  322 QTC Calculation: 474 R Axis:   22  Text Interpretation: Sinus tachycardia Low voltage, precordial leads Nonspecific T abnrm, anterolateral leads Confirmed by Yolande Charleston [phone]) on 06/29/2024 4:24:05 PM  Radiology: CT ABDOMEN PELVIS W CONTRAST Result Date: 06/29/2024 CLINICAL DATA:  Abdominal pain.  Nausea vomiting and diarrhea. EXAM: CT ABDOMEN AND PELVIS WITH CONTRAST TECHNIQUE: Multidetector CT imaging of the abdomen and pelvis was performed using the standard protocol following bolus administration of intravenous contrast. RADIATION DOSE REDUCTION: This exam was performed according to the departmental dose-optimization program which includes automated exposure control, adjustment of the mA and/or kV according to patient size and/or use of iterative reconstruction technique. CONTRAST:  OMNIPAQUE  IOHEXOL  300 MG/ML  SOLN COMPARISON:  CT dated 03/30/2024. FINDINGS: Lower chest: The visualized lung bases are clear. No intra-abdominal free air or free fluid. Hepatobiliary: No focal liver abnormality is seen. No gallstones, gallbladder wall thickening, or biliary dilatation. Pancreas: Unremarkable. No pancreatic ductal dilatation or surrounding inflammatory changes. Spleen: Normal in size without focal abnormality. Adrenals/Urinary Tract: The adrenal glands are  unremarkable. The kidneys, visualized ureters, and urinary bladder appear unremarkable. Stomach/Bowel: There is no bowel obstruction or active inflammation. The appendix is normal. Vascular/Lymphatic: Mild aortoiliac atherosclerotic disease. The IVC is unremarkable. No portal venous gas. There is no adenopathy. Reproductive: The uterus is anteverted. Probable 3 cm left fundal fibroid. Small bilateral ovarian cysts or follicles. No imaging follow-up. No suspicious adnexal masses. Other: None Musculoskeletal: No acute or significant osseous findings. IMPRESSION: 1. No acute intra-abdominal or pelvic pathology. 2.  Aortic Atherosclerosis (ICD10-I70.0). Electronically Signed   By: Vanetta Chou M.D.   On: 06/29/2024 13:15     .Critical Care  Performed by: Nicole Palma, PA-C Authorized by: Nicole Palma, PA-C   Critical care provider statement:    Critical care time (minutes):  45   Critical care was necessary to treat or prevent imminent or life-threatening deterioration of the following conditions:  Metabolic crisis (Metabolic acidosis)   Critical care was time spent personally by me on the following activities:  Development of treatment plan with patient or surrogate, discussions with consultants, evaluation of patient's response to treatment, examination of patient, ordering and review of laboratory studies, ordering and review of radiographic studies, ordering and performing treatments and interventions, pulse oximetry, re-evaluation of patient's condition, review of old charts and obtaining history from patient or surrogate   Care discussed with: admitting provider      Medications Ordered in the ED  LORazepam  (ATIVAN ) tablet 1-4 mg (has no administration in time range)    Or  LORazepam  (ATIVAN ) injection 1-4 mg (has no administration in time range)  thiamine  (VITAMIN B1) tablet 100 mg (100 mg Oral Given 06/29/24 1423)    Or  thiamine  (VITAMIN B1) injection 100 mg ( Intravenous See  Alternative 06/29/24 1423)  folic acid  (FOLVITE ) tablet 1 mg (1 mg Oral Given 06/29/24 1423)  multivitamin with minerals tablet 1 tablet (1 tablet Oral Given 06/29/24 1423)  sodium bicarbonate  150  mEq in dextrose  5 % 1,150 mL infusion ( Intravenous New Bag/Given 06/29/24 1642)  enoxaparin  (LOVENOX ) injection 40 mg (has no administration in time range)  pantoprazole  (PROTONIX ) injection 40 mg (40 mg Intravenous Given 06/29/24 1023)  lactated ringers  bolus 1,000 mL (0 mLs Intravenous Stopped 06/29/24 1124)  ondansetron  (ZOFRAN ) injection 4 mg (4 mg Intravenous Given 06/29/24 1020)  alum & mag hydroxide-simeth (MAALOX/MYLANTA) 200-200-20 MG/5ML suspension 30 mL (30 mLs Oral Given 06/29/24 1211)    And  lidocaine  (XYLOCAINE ) 2 % viscous mouth solution 15 mL (15 mLs Oral Given 06/29/24 1211)  sodium chloride  0.9 % bolus 1,000 mL (0 mLs Intravenous Stopped 06/29/24 1343)  prochlorperazine  (COMPAZINE ) injection 10 mg (10 mg Intravenous Given 06/29/24 1133)  dextrose  50 % solution 12.5 g (12.5 g Intravenous Given 06/29/24 1136)  iohexol  (OMNIPAQUE ) 300 MG/ML solution 100 mL (100 mLs Intravenous Contrast Given 06/29/24 1214)  diazepam  (VALIUM ) injection 5 mg (5 mg Intravenous Given 06/29/24 1231)  ketorolac  (TORADOL ) 15 MG/ML injection 15 mg (15 mg Intravenous Given 06/29/24 1424)    Clinical Course as of 06/29/24 1704  Tue Jun 29, 2024  1124 Patient still feeling nausea, no vomiting. Will get CT given lab abnormalities  [AF]  1135 Patient does report alcohol use, states she drinks 2-4 glasses of wine daily.  Will obtain ethanol level and VBG for further evaluation of her high anion gap [AF]  1404 After shared decision making conversation with patient, we will repeat labs. If no significant improvement in acidosis and anion gap, will plan on admission [AF]  1555 Patient tolerating water. Will PO challenge with crackers. Patient reports her HR is always fast and she has not been able to take her anxiety and pain  medications today. Upon check of previous ER records, HR does appear to be consistently tachycardic  [AF]    Clinical Course User Index [AF] Nicole Palma, PA-C                                 Medical Decision Making Amount and/or Complexity of Data Reviewed Labs: ordered. Radiology: ordered.  Risk OTC drugs. Prescription drug management. Decision regarding hospitalization.     Differential diagnosis includes but is not limited to Acute cholecystitis, cholelithiasis, cholangitis, choledocholithiasis, peptic ulcer, gastritis, gastroenteritis, appendicitis, IBS, IBD, DKA, nephrolithiasis, UTI, pyelonephritis, pancreatitis, diverticulitis, mesenteric ischemia, abdominal aortic aneurysm, small bowel obstruction, volvulus   ED Course:  Upon initial evaluation, patient is sick appearing, but no acute distress.  She has a tachycardia to the 120s upon arrival.  Afebrile.  She reports some mild abdominal pain, but I do not appreciate any abdominal tenderness on exam.  She does have an erythematous appearing posterior oropharynx.  TMs and external auditory canal bilaterally without any erythema or edema.  No ear drainage.  Feel her ear infection has likely resolved on its own as she also reports pain has resolved  Labs Ordered: I Ordered, and personally interpreted labs.  The pertinent results include:   CBC with leukocytosis of 16.3, hemoglobin elevated at 15.2 CMP with glucose of 66 upon arrival, elevated AST, ALT, ALK phos at 141, 88, and 171 respectively. Anion gap elevated at 31 VBG with acidosis of 7.147, low CO3 at 32, low bicarb at 11 Lipase within normal limits Urinalysis with ketones, no signs of infection UDS and ethanol level unremarkable Covid, flu, RSV, strep negative  Imaging Studies ordered: I ordered imaging studies including CT abdomen  and pelvis  I independently visualized the imaging with scope of interpretation limited to determining acute life threatening  conditions related to emergency care. Imaging showed  No acute abnormalities I agree with the radiologist interpretation   Cardiac Monitoring: / EKG: The patient was maintained on a cardiac monitor.  I personally viewed and interpreted the cardiac monitored which showed an underlying rhythm of: Sinus tachycardia   Consultations Obtained: I requested consultation with the hospitalist Dr. Dorinda,  and discussed lab and imaging findings as well as pertinent plan - they recommend: admission for further management. Will obtain repeat BMP here in ER to ensure K within normal range as VBG K seems like it may be due to hemolysis.   Medications Given: 1L NS bolus 1L LR bolus Maalox Protonix  Compazine  Zofran  5mg  Diazepam  Toradol  Sodium Bicarb infusion Thiamine , folic acid , multivitamin  2:10 Upon re-evaluation, patient remains well appearing but still persistently tachycardic into the 120's even after 2L of fluids. No active vomiting but reports nausea. Also gave diazepam  (currently in a shortage of IV lorazepam ) for suspicion of possible alcohol withdrawal, but this has not seemed to help. Started patient on CIWA due to her persistent tachycardia and history of alcohol use. Labs concerning for an anion gap metabolic acidosis. Due to not being able to keep anything down over the past 4 days, glucose 66 upon arrival, ketones in urine, suspect this is due to starvation ketoacidosis. LFTs elevated but abdomen not tender to palpation and CT scan unremarkable, likely from alcohol use. Patient would like to avoid admission if possible. Will repeat labs and PO challenge and see if there is any improvement. If no improvement in labs or unable to tolerate PO, she is in agreement with admission.  4 PM.  Upon reevaluation, patient reports she is feeling better.  She is tolerating intake of water at bedside.  Has not had any episodes of vomiting or diarrhea here today.  I did discuss concern for elevated heart rate  still, she reports that this is her baseline.  I did review previous records which do reveal heart rate persistently in the 110s normally at her visits.  She also remains with elevated anion gap at 24.  Borderline still acidotic with repeat VBG with pH at 7.29 and bicarb still significantly low at 10.2, C02 low at 21.1. Will plan on admission to ensure labs improve and she is tolerating p.o. intake adequately. Discussed case with my attending Dr. Jakie who recommends starting patient on bicarb infusion.     Impression: Starvation ketoacidosis  Disposition:  Admitted to [facility] the case was discussed with the admitting physician Dr. Dorinda   Record Review: External records from outside source obtained and reviewed including previous ER visits     This chart was dictated using voice recognition software, Dragon. Despite the best efforts of this provider to proofread and correct errors, errors may still occur which can change documentation meaning.       Final diagnoses:  Starvation ketoacidosis    ED Discharge Orders     None          Nicole Mccoy, DEVONNA 06/29/24 1704    Simon Lavonia SAILOR, MD 07/02/24 [phone]

## 2024-06-29 NOTE — ED Triage Notes (Signed)
 Pt reports N&V X 4 days. Also reports diarrhea. States unable to keep anything down. Multiple episodes of emesis this am.  Also reports acid reflux.

## 2024-06-29 NOTE — H&P (Signed)
 History and Physical  ALINA GILKEY FMW:[id number] DOB: 01/16/1981 DOA: 06/29/2024  PCP: Kristie Morene CROME, PA-C   Chief Complaint: Nausea, vomiting and diarrhea  HPI: Nicole Mccoy is a 43 y.o. female with medical history significant for fibromyalgia, migraines, Raynaud's syndrome, tachycardia, generalized anxiety disorder and a recent gastroenteritis who presented to med [facility] ED for evaluation of nausea, vomiting and diarrhea.  On Thursday afternoon, patient reports she ate a salad from the salad bar at [employer].  On Friday, she woke up with watery diarrhea that persisted throughout the weekend.  She started having nausea and vomiting on Sunday.  She took Pepto-Bismol pills without relief.  She has continued to have diarrhea, nausea and vomiting since then with inability to keep anything down.  She endorsed burning sensation in her throat and mild abdominal soreness but no abdominal pain, fever, chills, bloody stools, dysuria, dizziness or headache.  Reports that 3 months ago, she had a gastroenteritis from E. coli.  ED Course: Initial vitals show patient afebrile, normotensive but tachycardic with HR in the 110s to 120s. Initial labs significant for sodium 136, K+ 4.9, bicarb 10->17, anion gap 31->19, WBC 16.3, Hgb 15.2, glucose 66, negative flu, RSV and COVID test, UA shows significant ketonuria but no signs of infection, negative UDS and normal ethanol levels. EKG shows sinus tach.  CT A/P with no acute intra-abdominal or pelvic pathology. Pt received IV NS 1 L bolus x 2, IV Protonix , IV Zofran , IV Compazine , Maalox and Xylocaine  viscus suspension, IV Valium , D50 and started on D5 with sodium bicarb infusion.  Patient was admitted to [facility] service and transferred to [facility].  Review of Systems: Please see HPI for pertinent positives and negatives. A complete 10 system review of systems are otherwise negative.  Past Medical History:  Diagnosis Date   Fibromyalgia    Migraine     Pneumonia    Raynaud's syndrome    Past Surgical History:  Procedure Laterality Date   SHOULDER SURGERY     Social History:  reports that she has quit smoking. Her smoking use included cigarettes. She has never used smokeless tobacco. She reports current alcohol use. She reports that she does not use drugs.  Allergies  Allergen Reactions   Sulfa Antibiotics Anaphylaxis   Ibuprofen  Other (See Comments)   Lorazepam  Other (See Comments)    -Does not help with symptoms    History reviewed. No pertinent family history.   Prior to Admission medications   Medication Sig Start Date End Date Taking? Authorizing Provider  botulinum toxin Type A (BOTOX) 200 units injection Inject 200 Units into the muscle every 3 (three) months. 03/05/24   [provider]  Citalopram  Hydrobromide (CELEXA  PO) Take 40 mg by mouth at bedtime.    [provider]  clonazePAM  (KLONOPIN ) 1 MG tablet Take 1 mg by mouth 2 (two) times daily as needed for anxiety.    [provider]  cyclobenzaprine  (FLEXERIL ) 10 MG tablet Take 10 mg by mouth 3 (three) times daily as needed for muscle spasms.    [provider]  gabapentin  (NEURONTIN ) 300 MG capsule Take 600 mg by mouth 2 (two) times daily. 03/23/18   [provider]  loperamide  (IMODIUM ) 2 MG capsule Take 1 capsule (2 mg total) by mouth every 6 (six) hours as needed for diarrhea or loose stools. 04/01/24   Arlice Reichert, MD  methylphenidate 27 MG PO CR tablet Take 27 mg by mouth daily. 03/11/24 04/10/24  [provider]  potassium chloride  SA (KLOR-CON  M) 20 MEQ tablet Take 1 tablet (20 mEq total) by mouth 2 (two) times daily. 05/06/24   Donnajean Lynwood DEL, PA-C  promethazine  (PHENERGAN ) 12.5 MG tablet Take 1 tablet (12.5 mg total) by mouth every 6 (six) hours as needed for nausea or vomiting. 04/01/24   Arlice Reichert, MD  Topiramate  (TOPAMAX  PO) Take 200 mg by mouth daily.    [provider]  traMADol  (ULTRAM ) 50 MG  tablet Take 50 mg by mouth every 6 (six) hours as needed for moderate pain (pain score 4-6).    [provider]    Physical Exam: BP (!) 144/96 (BP Location: Right Arm)   Pulse (!) 115   Temp 98.9 F (37.2 C)   Resp 18   LMP 06/28/2024   SpO2 100%  General: Pleasant, well-appearing middle-age woman laying in bed. No acute distress. HEENT: [place]/AT. Anicteric sclera.  Dry mucous membrane. CV: Tachycardic.  Regular rhythm. No murmurs, rubs, or gallops. No LE edema Pulmonary: Lungs CTAB. Normal effort. No wheezing or rales. Abdominal: Soft, nontender, nondistended. Normal bowel sounds. Extremities: Palpable radial and DP pulses. Normal ROM. Skin: Warm and dry. No obvious rash or lesions. Neuro: A&Ox3. Moves all extremities. Normal sensation to light touch. No focal deficit. Psych: Normal mood and affect          Labs on Admission:  Basic Metabolic Panel: Recent Labs  Lab 06/29/24 1007 06/29/24 1151 06/29/24 1357 06/29/24 1434 06/29/24 2139  NA 136 133* 132* 130* 135  K 4.9 4.9 5.1 6.9* 4.1  CL 95*  --  98  --  100  CO2 10*  --  10*  --  17*  GLUCOSE 66*  --  73  --  115*  BUN 13  --  9  --  6  CREATININE 0.75  --  0.66  --  0.66  CALCIUM 8.7*  --  7.8*  --  8.6*   Liver Function Tests: Recent Labs  Lab 06/29/24 1007  AST 141*  ALT 88*  ALKPHOS 171*  BILITOT 0.3  PROT 7.5  ALBUMIN 4.5   Recent Labs  Lab 06/29/24 1007  LIPASE 14   No results for input(s): AMMONIA in the last 168 hours. CBC: Recent Labs  Lab 06/29/24 1007 06/29/24 1151 06/29/24 1434 06/29/24 2139  WBC 16.3*  --   --  9.7  NEUTROABS 13.9*  --   --   --   HGB 15.2* 14.6 13.6 12.9  HCT 46.0 43.0 40.0 37.5  MCV 102.7*  --   --  99.5  PLT 348  --   --  265   Cardiac Enzymes: No results for input(s): CKTOTAL, CKMB, CKMBINDEX, TROPONINI in the last 168 hours. BNP (last 3 results) No results for input(s): BNP in the last 8760 hours.  ProBNP (last 3 results) No results  for input(s): PROBNP in the last 8760 hours.  CBG: Recent Labs  Lab 06/29/24 1439  GLUCAP 88    Radiological Exams on Admission: CT ABDOMEN PELVIS W CONTRAST Result Date: 06/29/2024 CLINICAL DATA:  Abdominal pain.  Nausea vomiting and diarrhea. EXAM: CT ABDOMEN AND PELVIS WITH CONTRAST TECHNIQUE: Multidetector CT imaging of the abdomen and pelvis was performed using the standard protocol following bolus administration of intravenous contrast. RADIATION DOSE REDUCTION: This exam was performed according to the departmental dose-optimization program which includes automated exposure control, adjustment of the mA and/or kV according to patient size and/or use of iterative reconstruction  technique. CONTRAST:  OMNIPAQUE  IOHEXOL  300 MG/ML  SOLN COMPARISON:  CT dated 03/30/2024. FINDINGS: Lower chest: The visualized lung bases are clear. No intra-abdominal free air or free fluid. Hepatobiliary: No focal liver abnormality is seen. No gallstones, gallbladder wall thickening, or biliary dilatation. Pancreas: Unremarkable. No pancreatic ductal dilatation or surrounding inflammatory changes. Spleen: Normal in size without focal abnormality. Adrenals/Urinary Tract: The adrenal glands are unremarkable. The kidneys, visualized ureters, and urinary bladder appear unremarkable. Stomach/Bowel: There is no bowel obstruction or active inflammation. The appendix is normal. Vascular/Lymphatic: Mild aortoiliac atherosclerotic disease. The IVC is unremarkable. No portal venous gas. There is no adenopathy. Reproductive: The uterus is anteverted. Probable 3 cm left fundal fibroid. Small bilateral ovarian cysts or follicles. No imaging follow-up. No suspicious adnexal masses. Other: None Musculoskeletal: No acute or significant osseous findings. IMPRESSION: 1. No acute intra-abdominal or pelvic pathology. 2.  Aortic Atherosclerosis (ICD10-I70.0). Electronically Signed   By: Vanetta Chou M.D.   On: 06/29/2024 13:15    Assessment/Plan Nicole Mccoy is a 43 y.o. female with medical history significant for fibromyalgia, migraines, Raynaud's syndrome, tachycardia, generalized anxiety disorder and a recent gastroenteritis who presented to med [facility] ED for evaluation of nausea, vomiting and diarrhea and admitted for dehydration and gastroenteritis.  # Dehydration # Starvation ketosis # AGMA - Patient with persistent nausea, vomiting and diarrhea over the last 4 to 5 days - Initial labs showed significantly low bicarb of 10, AG 24 and UA with significant ketonuria - Findings consistent with starvation ketosis in the setting of severe dehydration from fluid losses - S/p D5 with bicarb infusion with improvement in labs - Continue D5 with bicarb, follow-up morning labs and adjust as needed  # Nausea and vomiting # Acute watery diarrhea # Acute gastroenteritis - Presented with 4-5 days of watery diarrhea, nausea and vomiting after eating a salad at [employer] food - CT A/P with no acute abnormalities, no significant abdominal tenderness on exam - Concern for acute gastroenteritis - IV Phenergan  as needed for nausea and vomiting - Follow-up GI panel - Check magnesium and phosphorus - Trend CBC, fever curve - Enteric precautions  # Tachycardia - Remains tachycardic with HR in the 100s to 120s - She has chronic tachycardia as far back as 2016 on EKG - Telemetry  # EtOH use disorder - Patient reports early drinking 2-4 glasses of wine 5-6 times a week - Normal ethanol levels, denies any history of EtOH withdrawal - Continue CIWA with Ativan  for now - Continue folate, thiamine  and MVI  # Fibromyalgia - Continue gabapentin , Celexa  and as needed tramadol   # GAD - Continue Celexa  and as needed Klonopin    DVT prophylaxis: Lovenox      Code Status: Full Code  Consults called: None  Family Communication: No family at bedside  Severity of Illness: The appropriate patient status for this  patient is OBSERVATION. Observation status is judged to be reasonable and necessary in order to provide the required intensity of service to ensure the patient's safety. The patient's presenting symptoms, physical exam findings, and initial radiographic and laboratory data in the context of their medical condition is felt to place them at decreased risk for further clinical deterioration. Furthermore, it is anticipated that the patient will be medically stable for discharge from the hospital within 2 midnights of admission.   Level of care: Telemetry   This record has been created using Conservation officer, historic buildings. Errors have been sought and corrected, but may not always  be located. Such creation errors do not reflect on the standard of care.   Lou Claretta HERO, MD 06/29/2024, 10:57 PM [facility] Pager: [phone] Isaiah 41:10   If 7PM-7AM, please contact night-coverage [email or web address] Password [email or web address]

## 2024-06-30 ENCOUNTER — Other Ambulatory Visit (HOSPITAL_COMMUNITY): Payer: Self-pay

## 2024-06-30 DIAGNOSIS — K529 Noninfective gastroenteritis and colitis, unspecified: Secondary | ICD-10-CM

## 2024-06-30 DIAGNOSIS — R112 Nausea with vomiting, unspecified: Secondary | ICD-10-CM

## 2024-06-30 DIAGNOSIS — A09 Infectious gastroenteritis and colitis, unspecified: Secondary | ICD-10-CM

## 2024-06-30 DIAGNOSIS — E876 Hypokalemia: Secondary | ICD-10-CM | POA: Insufficient documentation

## 2024-06-30 DIAGNOSIS — T730XXA Starvation, initial encounter: Principal | ICD-10-CM

## 2024-06-30 LAB — COMPREHENSIVE METABOLIC PANEL WITH GFR
ALT: 42 U/L (ref 0–44)
AST: 56 U/L — ABNORMAL HIGH (ref 15–41)
Albumin: 3.6 g/dL (ref 3.5–5.0)
Alkaline Phosphatase: 124 U/L (ref 38–126)
Anion gap: 10 (ref 5–15)
BUN: <5 mg/dL — ABNORMAL LOW (ref 6–20)
CO2: 21 mmol/L — ABNORMAL LOW (ref 22–32)
Calcium: 8.4 mg/dL — ABNORMAL LOW (ref 8.9–10.3)
Chloride: 103 mmol/L (ref 98–111)
Creatinine, Ser: 0.55 mg/dL (ref 0.44–1.00)
GFR, Estimated: >60 mL/min (ref >60)
Glucose, Bld: 95 mg/dL (ref 70–99)
Potassium: 3.1 mmol/L — ABNORMAL LOW (ref 3.5–5.1)
Sodium: 134 mmol/L — ABNORMAL LOW (ref 135–145)
Total Bilirubin: 1.1 mg/dL (ref 0.0–1.2)
Total Protein: 6.7 g/dL (ref 6.5–8.1)

## 2024-06-30 LAB — CBC
HCT: 39.8 % (ref 36.0–46.0)
Hemoglobin: 13.1 g/dL (ref 12.0–15.0)
MCH: 34 pg (ref 26.0–34.0)
MCHC: 32.9 g/dL (ref 30.0–36.0)
MCV: 103.4 fL — ABNORMAL HIGH (ref 80.0–100.0)
Platelets: 248 K/uL (ref 150–400)
RBC: 3.85 MIL/uL — ABNORMAL LOW (ref 3.87–5.11)
RDW: 14.2 % (ref 11.5–15.5)
WBC: 6.5 K/uL (ref 4.0–10.5)
nRBC: 0 % (ref 0.0–0.2)

## 2024-06-30 LAB — MAGNESIUM: Magnesium: 2.2 mg/dL (ref 1.7–2.4)

## 2024-06-30 LAB — PHOSPHORUS: Phosphorus: 1.9 mg/dL — ABNORMAL LOW (ref 2.5–4.6)

## 2024-06-30 MED ORDER — SENNOSIDES-DOCUSATE SODIUM 8.6-50 MG PO TABS: 1.0000 | ORAL_TABLET | Freq: Every evening | ORAL | Status: DC | PRN

## 2024-06-30 MED ORDER — CITALOPRAM HYDROBROMIDE 20 MG PO TABS: 40.0000 mg | ORAL_TABLET | Freq: Every day | ORAL | Status: DC

## 2024-06-30 MED ORDER — CLONAZEPAM 1 MG PO TABS
1.0000 mg | ORAL_TABLET | Freq: Two times a day (BID) | ORAL | Status: DC | PRN
Start: 2024-06-30 — End: 2024-06-30
  Administered 2024-06-30 (×2): 1 mg via ORAL
  Filled 2024-06-30 (×2): qty 1

## 2024-06-30 MED ORDER — BISACODYL 5 MG PO TBEC: 5.0000 mg | DELAYED_RELEASE_TABLET | Freq: Every day | ORAL | Status: DC | PRN

## 2024-06-30 MED ORDER — PROMETHAZINE HCL 25 MG PO TABS
25.0000 mg | ORAL_TABLET | Freq: Four times a day (QID) | ORAL | 0 refills | Status: AC | PRN
  Filled 2024-06-30: qty 30, 8d supply, fill #0

## 2024-06-30 MED ORDER — ONDANSETRON HCL 4 MG PO TABS
4.0000 mg | ORAL_TABLET | Freq: Four times a day (QID) | ORAL | 0 refills | Status: AC | PRN
  Filled 2024-06-30: qty 30, 8d supply, fill #0

## 2024-06-30 MED ORDER — SODIUM CHLORIDE 0.9 % IV SOLN
12.5000 mg | Freq: Four times a day (QID) | INTRAVENOUS | Status: DC | PRN
  Administered 2024-06-30 (×2): 12.5 mg via INTRAVENOUS
  Filled 2024-06-30 (×2): qty 12.5

## 2024-06-30 MED ORDER — POTASSIUM CHLORIDE CRYS ER 20 MEQ PO TBCR
40.0000 meq | EXTENDED_RELEASE_TABLET | Freq: Once | ORAL | Status: DC
Start: 2024-06-30 — End: 2024-06-30

## 2024-06-30 MED ORDER — GABAPENTIN 300 MG PO CAPS
600.0000 mg | ORAL_CAPSULE | Freq: Two times a day (BID) | ORAL | Status: DC
  Administered 2024-06-30: 600 mg via ORAL
  Filled 2024-06-30: qty 2

## 2024-06-30 MED ORDER — TRAMADOL HCL 50 MG PO TABS
50.0000 mg | ORAL_TABLET | Freq: Four times a day (QID) | ORAL | Status: DC | PRN
  Administered 2024-06-30 (×3): 50 mg via ORAL
  Filled 2024-06-30 (×3): qty 1

## 2024-06-30 MED ORDER — ACETAMINOPHEN 650 MG RE SUPP
650.0000 mg | Freq: Four times a day (QID) | RECTAL | Status: DC | PRN
Start: 2024-06-30 — End: 2024-06-30

## 2024-06-30 MED ORDER — GABAPENTIN 300 MG PO CAPS
600.0000 mg | ORAL_CAPSULE | ORAL | Status: AC
  Administered 2024-06-30: 600 mg via ORAL
  Filled 2024-06-30: qty 2

## 2024-06-30 MED ORDER — GABAPENTIN 300 MG PO CAPS: 600.0000 mg | ORAL_CAPSULE | Freq: Two times a day (BID) | ORAL | Status: DC

## 2024-06-30 MED ORDER — CITALOPRAM HYDROBROMIDE 20 MG PO TABS
40.0000 mg | ORAL_TABLET | ORAL | Status: AC
  Administered 2024-06-30: 40 mg via ORAL
  Filled 2024-06-30: qty 2

## 2024-06-30 MED ORDER — ACETAMINOPHEN 325 MG PO TABS: 650.0000 mg | ORAL_TABLET | Freq: Four times a day (QID) | ORAL | Status: DC | PRN

## 2024-06-30 MED ORDER — PROMETHAZINE HCL 25 MG/ML IJ SOLN: 12.5000 mg | Freq: Four times a day (QID) | INTRAMUSCULAR | Status: DC | PRN

## 2024-06-30 MED ORDER — PROMETHAZINE HCL 25 MG RE SUPP
25.0000 mg | Freq: Four times a day (QID) | RECTAL | 0 refills | Status: AC | PRN
  Filled 2024-06-30: qty 8, 2d supply, fill #0

## 2024-06-30 NOTE — Progress Notes (Signed)
 PROGRESS NOTE    Nicole Mccoy  FMW:[id number] DOB: [birth year] DOA: 06/29/2024 PCP: Kristie Morene CROME, PA-C  Subjective: Pt seen and examined. Pt wants to go home. No further diarrhea. Occ nausea. No vomiting. States her co-worker had gastroenteritis last week. Pt states she only has 2 glasses of wine a day. No prior hx of etoh withdrawal. Pt states she wants to go home this evening.   Hospital Course: HPI: Nicole Mccoy is a 43 y.o. female with medical history significant for fibromyalgia, migraines, Raynaud's syndrome, tachycardia, generalized anxiety disorder and a recent gastroenteritis who presented to med [facility] ED for evaluation of nausea, vomiting and diarrhea.  On Thursday afternoon, patient reports she ate a salad from the salad bar at [employer].  On Friday, she woke up with watery diarrhea that persisted throughout the weekend.  She started having nausea and vomiting on Sunday.  She took Pepto-Bismol pills without relief.  She has continued to have diarrhea, nausea and vomiting since then with inability to keep anything down.  She endorsed burning sensation in her throat and mild abdominal soreness but no abdominal pain, fever, chills, bloody stools, dysuria, dizziness or headache.  Reports that 3 months ago, she had a gastroenteritis from E. coli.   ED Course: Initial vitals show patient afebrile, normotensive but tachycardic with HR in the 110s to 120s. Initial labs significant for sodium 136, K+ 4.9, bicarb 10->17, anion gap 31->19, WBC 16.3, Hgb 15.2, glucose 66, negative flu, RSV and COVID test, UA shows significant ketonuria but no signs of infection, negative UDS and normal ethanol levels. EKG shows sinus tach.  CT A/P with no acute intra-abdominal or pelvic pathology. Pt received IV NS 1 L bolus x 2, IV Protonix , IV Zofran , IV Compazine , Maalox and Xylocaine  viscus suspension, IV Valium , D50 and started on D5 with sodium bicarb infusion.  Patient was admitted to [facility]H  service and transferred to [facility].  Significant Events: Admitted 06/29/2024 N/V, diarrhea   Admission Labs: WBC 16.3, HgB 15.2, plt 348 Group A strep Negative Covid/flu/rsv negative Lipase 14 Na 136, K 4.9, CL 95, CO2 of 10, BUN 13, scr 0.75, glu 66 T prot 7.5, alb 4.5, AST 141, ALT 88, alk phos 171, t. Bili 0.3 UA trace HgB, ketone > 80, negative nitrite, negative LE UDS negative  Admission Imaging Studies: CT abd/pelvis No acute intra-abdominal or pelvic pathology. 2.  Aortic Atherosclerosis  Significant Labs:   Significant Imaging Studies:   Antibiotic Therapy: Anti-infectives (From admission, onward)    None       Procedures:   Consultants:     Assessment and Plan: * Severe dehydration 06-30-2024 admitted for dehydration/acute metabolic acidosis. Given IVF with bicarb. Her acidosis has nearly resolved. No longer having N/V. No diarrhea today. Wants to go home. Agreeable to staying on bland diet at home. Prn zofran , prn phenergan , prn phenergan  supp. Work note provided.  Acute gastroenteritis 06-30-2024 no longer having any diarrhea. Stool sample not collected for testing.  Intractable nausea and vomiting 06-30-2024 improved with IVF. Will send home with prn antiemetics  Acute infectious diarrhea 06-30-2024 likely viral, not bacterial. States her co-worker had gastroenteritis last week  Starvation ketoacidosis 06-30-2024 pt able to drink fluids without N/V. Discussed eating bland diet at home.  Chronic tachycardia 06-30-2024 stable.  Hypokalemia 06-30-2024 will give 40 meq kcl prior to discharge.   DVT prophylaxis: enoxaparin  (LOVENOX ) injection 40 mg Start: 06/29/24 1700    Code Status: Full Code Family  Communication: no family at bedside. She is decisional. Disposition Plan: home Reason for continuing need for hospitalization: stable for DC  Objective: Vitals:   06/30/24 0449 06/30/24 0836 06/30/24 0920 06/30/24 1246  BP: 127/87 124/82  113/78 (!) 128/95  Pulse: (!) 106 97 97 (!) 102  Resp: 18  16   Temp: 98.9 F (37.2 C) 98.2 F (36.8 C) 98.4 F (36.9 C) 98.7 F (37.1 C)  TempSrc:  Oral Oral Oral  SpO2: 100% 99% 98% 94%   No intake or output data in the 24 hours ending 06/30/24 1532 There were no vitals filed for this visit.  Examination:  Physical Exam Vitals and nursing note reviewed.  Constitutional:      General: She is not in acute distress.    Appearance: She is not toxic-appearing or diaphoretic.  HENT:     Head: Normocephalic and atraumatic.     Nose: Nose normal.  Eyes:     General: No scleral icterus. Pulmonary:     Effort: No respiratory distress.  Abdominal:     General: There is no distension.  Skin:    General: Skin is dry.  Neurological:     General: No focal deficit present.     Mental Status: She is alert and oriented to person, place, and time.     Data Reviewed: I have personally reviewed following labs and imaging studies  CBC: Recent Labs  Lab 06/29/24 1007 06/29/24 1151 06/29/24 1434 06/29/24 2139 06/30/24 0610  WBC 16.3*  --   --  9.7 6.5  NEUTROABS 13.9*  --   --   --   --   HGB 15.2* 14.6 13.6 12.9 13.1  HCT 46.0 43.0 40.0 37.5 39.8  MCV 102.7*  --   --  99.5 103.4*  PLT 348  --   --  265 248   Basic Metabolic Panel: Recent Labs  Lab 06/29/24 1007 06/29/24 1151 06/29/24 1357 06/29/24 1434 06/29/24 2139 06/30/24 0610  NA 136 133* 132* 130* 135 134*  K 4.9 4.9 5.1 6.9* 4.1 3.1*  CL 95*  --  98  --  100 103  CO2 10*  --  10*  --  17* 21*  GLUCOSE 66*  --  73  --  115* 95  BUN 13  --  9  --  6 <5*  CREATININE 0.75  --  0.66  --  0.66 0.55  CALCIUM 8.7*  --  7.8*  --  8.6* 8.4*  MG  --   --   --   --   --  2.2  PHOS  --   --   --   --   --  1.9*   GFR: CrCl cannot be calculated (Unknown ideal weight.). Liver Function Tests: Recent Labs  Lab 06/29/24 1007 06/30/24 0610  AST 141* 56*  ALT 88* 42  ALKPHOS 171* 124  BILITOT 0.3 1.1  PROT 7.5 6.7   ALBUMIN 4.5 3.6   Recent Labs  Lab 06/29/24 1007  LIPASE 14   CBG: Recent Labs  Lab 06/29/24 1439  GLUCAP 88    Recent Results (from the past 240 hours)  Resp panel by RT-PCR (RSV, Flu A&B, Covid) Anterior Nasal Swab     Status: None   Collection Time: 06/29/24 10:07 AM   Specimen: Anterior Nasal Swab  Result Value Ref Range Status   SARS Coronavirus 2 by RT PCR NEGATIVE NEGATIVE Final    Comment: (NOTE) SARS-CoV-2 target nucleic acids are NOT  DETECTED.  The SARS-CoV-2 RNA is generally detectable in upper respiratory specimens during the acute phase of infection. The lowest concentration of SARS-CoV-2 viral copies this assay can detect is 138 copies/mL. A negative result does not preclude SARS-Cov-2 infection and should not be used as the sole basis for treatment or other patient management decisions. A negative result may occur with  improper specimen collection/handling, submission of specimen other than nasopharyngeal swab, presence of viral mutation(s) within the areas targeted by this assay, and inadequate number of viral copies(<138 copies/mL). A negative result must be combined with clinical observations, patient history, and epidemiological information. The expected result is Negative.  Fact Sheet for Patients:  BloggerCourse.com  Fact Sheet for Healthcare Providers:  SeriousBroker.it  This test is no t yet approved or cleared by the [place]  FDA and  has been authorized for detection and/or diagnosis of SARS-CoV-2 by FDA under an Emergency Use Authorization (EUA). This EUA will remain  in effect (meaning this test can be used) for the duration of the COVID-19 declaration under Section 564(b)(1) of the Act, 21 U.S.C.section 360bbb-3(b)(1), unless the authorization is terminated  or revoked sooner.       Influenza A by PCR NEGATIVE NEGATIVE Final   Influenza B by PCR NEGATIVE NEGATIVE Final     Comment: (NOTE) The Xpert Xpress SARS-CoV-2/FLU/RSV plus assay is intended as an aid in the diagnosis of influenza from Nasopharyngeal swab specimens and should not be used as a sole basis for treatment. Nasal washings and aspirates are unacceptable for Xpert Xpress SARS-CoV-2/FLU/RSV testing.  Fact Sheet for Patients: BloggerCourse.com  Fact Sheet for Healthcare Providers: SeriousBroker.it  This test is not yet approved or cleared by the [place]  FDA and has been authorized for detection and/or diagnosis of SARS-CoV-2 by FDA under an Emergency Use Authorization (EUA). This EUA will remain in effect (meaning this test can be used) for the duration of the COVID-19 declaration under Section 564(b)(1) of the Act, 21 U.S.C. section 360bbb-3(b)(1), unless the authorization is terminated or revoked.     Resp Syncytial Virus by PCR NEGATIVE NEGATIVE Final    Comment: (NOTE) Fact Sheet for Patients: BloggerCourse.com  Fact Sheet for Healthcare Providers: SeriousBroker.it  This test is not yet approved or cleared by the [place]  FDA and has been authorized for detection and/or diagnosis of SARS-CoV-2 by FDA under an Emergency Use Authorization (EUA). This EUA will remain in effect (meaning this test can be used) for the duration of the COVID-19 declaration under Section 564(b)(1) of the Act, 21 U.S.C. section 360bbb-3(b)(1), unless the authorization is terminated or revoked.  Performed at [facility], [address] [place], [place] [address]   Group A Strep by PCR     Status: None   Collection Time: 06/29/24 10:07 AM   Specimen: Throat; Sterile Swab  Result Value Ref Range Status   Group A Strep by PCR NOT DETECTED NOT DETECTED Final    Comment: Performed at [facility], [address] [place], [place] [address]     Radiology Studies: CT  ABDOMEN PELVIS W CONTRAST Result Date: 06/29/2024 CLINICAL DATA:  Abdominal pain.  Nausea vomiting and diarrhea. EXAM: CT ABDOMEN AND PELVIS WITH CONTRAST TECHNIQUE: Multidetector CT imaging of the abdomen and pelvis was performed using the standard protocol following bolus administration of intravenous contrast. RADIATION DOSE REDUCTION: This exam was performed according to the departmental dose-optimization program which includes automated exposure control, adjustment of the mA and/or kV according  to patient size and/or use of iterative reconstruction technique. CONTRAST:  OMNIPAQUE  IOHEXOL  300 MG/ML  SOLN COMPARISON:  CT dated 03/30/2024. FINDINGS: Lower chest: The visualized lung bases are clear. No intra-abdominal free air or free fluid. Hepatobiliary: No focal liver abnormality is seen. No gallstones, gallbladder wall thickening, or biliary dilatation. Pancreas: Unremarkable. No pancreatic ductal dilatation or surrounding inflammatory changes. Spleen: Normal in size without focal abnormality. Adrenals/Urinary Tract: The adrenal glands are unremarkable. The kidneys, visualized ureters, and urinary bladder appear unremarkable. Stomach/Bowel: There is no bowel obstruction or active inflammation. The appendix is normal. Vascular/Lymphatic: Mild aortoiliac atherosclerotic disease. The IVC is unremarkable. No portal venous gas. There is no adenopathy. Reproductive: The uterus is anteverted. Probable 3 cm left fundal fibroid. Small bilateral ovarian cysts or follicles. No imaging follow-up. No suspicious adnexal masses. Other: None Musculoskeletal: No acute or significant osseous findings. IMPRESSION: 1. No acute intra-abdominal or pelvic pathology. 2.  Aortic Atherosclerosis (ICD10-I70.0). Electronically Signed   By: Vanetta Chou M.D.   On: 06/29/2024 13:15    Scheduled Meds:  citalopram   40 mg Oral QHS   enoxaparin  (LOVENOX ) injection  40 mg Subcutaneous Q24H   folic acid   1 mg Oral Daily    gabapentin   600 mg Oral BID   multivitamin with minerals  1 tablet Oral Daily   potassium chloride   40 mEq Oral Once   thiamine   100 mg Oral Daily   Or   thiamine   100 mg Intravenous Daily   Continuous Infusions:  promethazine  (PHENERGAN ) injection (IM or IVPB) 12.5 mg (06/30/24 1306)     LOS: 0 days   Time spent: 60 minutes  Camellia Door, DO  [facility]  06/30/2024, 3:32 PM

## 2024-06-30 NOTE — Assessment & Plan Note (Addendum)
 06-30-2024 likely viral, not bacterial. States her co-worker had gastroenteritis last week

## 2024-06-30 NOTE — Assessment & Plan Note (Signed)
 06-30-2024 pt able to drink fluids without N/V. Discussed eating bland diet at home.

## 2024-06-30 NOTE — Assessment & Plan Note (Signed)
 06-30-2024 will give 40 meq kcl prior to discharge.

## 2024-06-30 NOTE — Subjective & Objective (Addendum)
 Pt seen and examined. Pt wants to go home. No further diarrhea. Occ nausea. No vomiting. States her co-worker had gastroenteritis last week. Pt states she only has 2 glasses of wine a day. No prior hx of etoh withdrawal. Pt states she wants to go home this evening.

## 2024-06-30 NOTE — Progress Notes (Signed)
   06/30/24 0934  TOC Brief Assessment  Insurance and Status Lapsed  Patient has primary care physician Yes  Home environment has been reviewed [facility]  Prior level of function: independent  Prior/Current Home Services No current home services  Social Drivers of Health Review SDOH reviewed no interventions necessary  Readmission risk has been reviewed Yes  Transition of care needs no transition of care needs at this time     Heather Saltness, MSW, LCSW Clinical Social Worker Inpatient Care Management 06/30/2024 9:34 AM

## 2024-06-30 NOTE — Progress Notes (Addendum)
 Discharge instructions have been reviewed with the patient. Discussed PO potassium. Pt states she has experienced intermittent nausea and is now feeling better.She denied questions or concerns at this time. No change from am assessment. IV removed, site is clean dry and intact. Medications (x2) were delivered from the [facility] outpatient pharmacy. Telemetry box #56 was returned to the desk.

## 2024-06-30 NOTE — Assessment & Plan Note (Signed)
 06-30-2024 improved with IVF. Will send home with prn antiemetics

## 2024-06-30 NOTE — Discharge Summary (Signed)
 [facility] Discharge Summary   Patient name: Nicole Mccoy  Admit date:     06/29/2024  Discharge date: 06/30/2024  Attending Physician: DORINDA DRUE DASEN [[id number]]  Discharge Physician: Camellia Door   PCP: Kristie Morene CROME, PA-C  Admitted From: Home  Disposition:  Home  Recommendations for Outpatient Follow-up:  Follow up with PCP in 1-2 weeks  Home Health:No Equipment/Devices: None  Discharge Condition:Stable CODE STATUS:FULL Diet recommendation: Benjamine Fluid Restriction: None  Hospital Summary: HPI: Nicole Mccoy is a 43 y.o. female with medical history significant for fibromyalgia, migraines, Raynaud's syndrome, tachycardia, generalized anxiety disorder and a recent gastroenteritis who presented to med [facility] ED for evaluation of nausea, vomiting and diarrhea.  On Thursday afternoon, patient reports she ate a salad from the salad bar at [employer].  On Friday, she woke up with watery diarrhea that persisted throughout the weekend.  She started having nausea and vomiting on Sunday.  She took Pepto-Bismol pills without relief.  She has continued to have diarrhea, nausea and vomiting since then with inability to keep anything down.  She endorsed burning sensation in her throat and mild abdominal soreness but no abdominal pain, fever, chills, bloody stools, dysuria, dizziness or headache.  Reports that 3 months ago, she had a gastroenteritis from E. coli.   ED Course: Initial vitals show patient afebrile, normotensive but tachycardic with HR in the 110s to 120s. Initial labs significant for sodium 136, K+ 4.9, bicarb 10->17, anion gap 31->19, WBC 16.3, Hgb 15.2, glucose 66, negative flu, RSV and COVID test, UA shows significant ketonuria but no signs of infection, negative UDS and normal ethanol levels. EKG shows sinus tach.  CT A/P with no acute intra-abdominal or pelvic pathology. Pt received IV NS 1 L bolus x 2, IV Protonix , IV Zofran , IV Compazine , Maalox  and Xylocaine  viscus suspension, IV Valium , D50 and started on D5 with sodium bicarb infusion.  Patient was admitted to [facility] service and transferred to [facility].  Significant Events: Admitted 06/29/2024 N/V, diarrhea   Admission Labs: WBC 16.3, HgB 15.2, plt 348 Group A strep Negative Covid/flu/rsv negative Lipase 14 Na 136, K 4.9, CL 95, CO2 of 10, BUN 13, scr 0.75, glu 66 T prot 7.5, alb 4.5, AST 141, ALT 88, alk phos 171, t. Bili 0.3 UA trace HgB, ketone > 80, negative nitrite, negative LE UDS negative  Admission Imaging Studies: CT abd/pelvis No acute intra-abdominal or pelvic pathology. 2.  Aortic Atherosclerosis  Significant Labs:   Significant Imaging Studies:   Antibiotic Therapy: Anti-infectives (From admission, onward)    None       Procedures:   Consultants:    Hospital Course by Problem: * Severe dehydration 06-30-2024 admitted for dehydration/acute metabolic acidosis. Given IVF with bicarb. Her acidosis has nearly resolved. No longer having N/V. No diarrhea today. Wants to go home. Agreeable to staying on bland diet at home. Prn zofran , prn phenergan , prn phenergan  supp. Work note provided.  Acute gastroenteritis 06-30-2024 no longer having any diarrhea. Stool sample not collected for testing.  Intractable nausea and vomiting 06-30-2024 improved with IVF. Will send home with prn antiemetics  Acute infectious diarrhea 06-30-2024 likely viral, not bacterial. States her co-worker had gastroenteritis last week  Starvation ketoacidosis 06-30-2024 pt able to drink fluids without N/V. Discussed eating bland diet at home.  Chronic tachycardia 06-30-2024 stable.  Hypokalemia 06-30-2024 will give 40 meq kcl prior to discharge.    Discharge Diagnoses:  Principal Problem:   Severe  dehydration Active Problems:   Acute gastroenteritis   Intractable nausea and vomiting   Chronic tachycardia   Starvation ketoacidosis   Acute infectious diarrhea    Hypokalemia   Discharge Instructions  Discharge Instructions     Call MD for:  difficulty breathing, headache or visual disturbances   Complete by: As directed    Call MD for:  extreme fatigue   Complete by: As directed    Call MD for:  hives   Complete by: As directed    Call MD for:  persistant dizziness or light-headedness   Complete by: As directed    Call MD for:  persistant nausea and vomiting   Complete by: As directed    Call MD for:  redness, tenderness, or signs of infection (pain, swelling, redness, odor or green/yellow discharge around incision site)   Complete by: As directed    Call MD for:  severe uncontrolled pain   Complete by: As directed    Call MD for:  temperature >100.4   Complete by: As directed    DIET SOFT   Complete by: As directed    Discharge instructions   Complete by: As directed    1. Follow up with your primary care provider in 1-2 weeks following discharge from hospital. 2. Stay on a soft, bland diet until your nausea/diarrhea has completely resolved.   Increase activity slowly   Complete by: As directed       Allergies as of 06/30/2024       Reactions   Sulfa Antibiotics Anaphylaxis   Ibuprofen  Other (See Comments)   Lorazepam  Other (See Comments)   -Does not help with symptoms        Medication List     TAKE these medications    citalopram  40 MG tablet Commonly known as: CELEXA  Take 40 mg by mouth daily.   clonazePAM  1 MG tablet Commonly known as: KLONOPIN  Take 1 mg by mouth 2 (two) times daily as needed for anxiety.   gabapentin  300 MG capsule Commonly known as: NEURONTIN  Take 600 mg by mouth 2 (two) times daily.   loperamide  2 MG capsule Commonly known as: IMODIUM  Take 1 capsule (2 mg total) by mouth every 6 (six) hours as needed for diarrhea or loose stools.   methylphenidate 27 MG CR tablet Commonly known as: CONCERTA Take 27 mg by mouth daily.   ondansetron  4 MG tablet Commonly known as: Zofran  Take 1  tablet (4 mg total) by mouth every 6 (six) hours as needed for nausea or vomiting.   promethazine  25 MG tablet Commonly known as: PHENERGAN  Take 1 tablet (25 mg total) by mouth every 6 (six) hours as needed for nausea or vomiting. What changed:  medication strength how much to take   promethazine  25 MG suppository Commonly known as: PHENERGAN  Place 1 suppository (25 mg total) rectally every 6 (six) hours as needed for nausea or vomiting. What changed: You were already taking a medication with the same name, and this prescription was added. Make sure you understand how and when to take each.   traMADol  50 MG tablet Commonly known as: ULTRAM  Take 100 mg by mouth in the morning and at bedtime.        Allergies  Allergen Reactions   Sulfa Antibiotics Anaphylaxis   Ibuprofen  Other (See Comments)   Lorazepam  Other (See Comments)    -Does not help with symptoms    Discharge Exam: Vitals:   06/30/24 0920 06/30/24 1246  BP: 113/78 (!) 128/95  Pulse: 97 (!) 102  Resp: 16   Temp: 98.4 F (36.9 C) 98.7 F (37.1 C)  SpO2: 98% 94%    Physical Exam Vitals and nursing note reviewed.  Constitutional:      General: She is not in acute distress.    Appearance: She is not toxic-appearing or diaphoretic.  HENT:     Head: Normocephalic and atraumatic.     Nose: Nose normal.  Eyes:     General: No scleral icterus. Pulmonary:     Effort: No respiratory distress.  Abdominal:     General: There is no distension.  Skin:    General: Skin is dry.  Neurological:     General: No focal deficit present.     Mental Status: She is alert and oriented to person, place, and time.     The results of significant diagnostics from this hospitalization (including imaging, microbiology, ancillary and laboratory) are listed below for reference.    Microbiology: Recent Results (from the past 240 hours)  Resp panel by RT-PCR (RSV, Flu A&B, Covid) Anterior Nasal Swab     Status: None    Collection Time: 06/29/24 10:07 AM   Specimen: Anterior Nasal Swab  Result Value Ref Range Status   SARS Coronavirus 2 by RT PCR NEGATIVE NEGATIVE Final    Comment: (NOTE) SARS-CoV-2 target nucleic acids are NOT DETECTED.  The SARS-CoV-2 RNA is generally detectable in upper respiratory specimens during the acute phase of infection. The lowest concentration of SARS-CoV-2 viral copies this assay can detect is 138 copies/mL. A negative result does not preclude SARS-Cov-2 infection and should not be used as the sole basis for treatment or other patient management decisions. A negative result may occur with  improper specimen collection/handling, submission of specimen other than nasopharyngeal swab, presence of viral mutation(s) within the areas targeted by this assay, and inadequate number of viral copies(<138 copies/mL). A negative result must be combined with clinical observations, patient history, and epidemiological information. The expected result is Negative.  Fact Sheet for Patients:  BloggerCourse.com  Fact Sheet for Healthcare Providers:  SeriousBroker.it  This test is no t yet approved or cleared by the [place]  FDA and  has been authorized for detection and/or diagnosis of SARS-CoV-2 by FDA under an Emergency Use Authorization (EUA). This EUA will remain  in effect (meaning this test can be used) for the duration of the COVID-19 declaration under Section 564(b)(1) of the Act, 21 U.S.C.section 360bbb-3(b)(1), unless the authorization is terminated  or revoked sooner.       Influenza A by PCR NEGATIVE NEGATIVE Final   Influenza B by PCR NEGATIVE NEGATIVE Final    Comment: (NOTE) The Xpert Xpress SARS-CoV-2/FLU/RSV plus assay is intended as an aid in the diagnosis of influenza from Nasopharyngeal swab specimens and should not be used as a sole basis for treatment. Nasal washings and aspirates are unacceptable for  Xpert Xpress SARS-CoV-2/FLU/RSV testing.  Fact Sheet for Patients: BloggerCourse.com  Fact Sheet for Healthcare Providers: SeriousBroker.it  This test is not yet approved or cleared by the [place]  FDA and has been authorized for detection and/or diagnosis of SARS-CoV-2 by FDA under an Emergency Use Authorization (EUA). This EUA will remain in effect (meaning this test can be used) for the duration of the COVID-19 declaration under Section 564(b)(1) of the Act, 21 U.S.C. section 360bbb-3(b)(1), unless the authorization is terminated or revoked.     Resp Syncytial Virus by PCR NEGATIVE NEGATIVE Final    Comment: (NOTE) Fact Sheet  for Patients: BloggerCourse.com  Fact Sheet for Healthcare Providers: SeriousBroker.it  This test is not yet approved or cleared by the [place]  FDA and has been authorized for detection and/or diagnosis of SARS-CoV-2 by FDA under an Emergency Use Authorization (EUA). This EUA will remain in effect (meaning this test can be used) for the duration of the COVID-19 declaration under Section 564(b)(1) of the Act, 21 U.S.C. section 360bbb-3(b)(1), unless the authorization is terminated or revoked.  Performed at [facility], [address] [place], [place] [address]   Group A Strep by PCR     Status: None   Collection Time: 06/29/24 10:07 AM   Specimen: Throat; Sterile Swab  Result Value Ref Range Status   Group A Strep by PCR NOT DETECTED NOT DETECTED Final    Comment: Performed at [facility], [address] [place], [place] [address]     Labs: BNP (last 3 results) No results for input(s): BNP in the last 8760 hours. Basic Metabolic Panel: Recent Labs  Lab 06/29/24 1007 06/29/24 1151 06/29/24 1357 06/29/24 1434 06/29/24 2139 06/30/24 0610  NA 136 133* 132* 130* 135 134*  K 4.9 4.9 5.1 6.9* 4.1 3.1*  CL  95*  --  98  --  100 103  CO2 10*  --  10*  --  17* 21*  GLUCOSE 66*  --  73  --  115* 95  BUN 13  --  9  --  6 <5*  CREATININE 0.75  --  0.66  --  0.66 0.55  CALCIUM 8.7*  --  7.8*  --  8.6* 8.4*  MG  --   --   --   --   --  2.2  PHOS  --   --   --   --   --  1.9*   Liver Function Tests: Recent Labs  Lab 06/29/24 1007 06/30/24 0610  AST 141* 56*  ALT 88* 42  ALKPHOS 171* 124  BILITOT 0.3 1.1  PROT 7.5 6.7  ALBUMIN 4.5 3.6   Recent Labs  Lab 06/29/24 1007  LIPASE 14   CBC: Recent Labs  Lab 06/29/24 1007 06/29/24 1151 06/29/24 1434 06/29/24 2139 06/30/24 0610  WBC 16.3*  --   --  9.7 6.5  NEUTROABS 13.9*  --   --   --   --   HGB 15.2* 14.6 13.6 12.9 13.1  HCT 46.0 43.0 40.0 37.5 39.8  MCV 102.7*  --   --  99.5 103.4*  PLT 348  --   --  265 248   CBG: Recent Labs  Lab 06/29/24 1439  GLUCAP 88   Urinalysis    Component Value Date/Time   COLORURINE YELLOW 06/29/2024 1123   APPEARANCEUR CLEAR 06/29/2024 1123   LABSPEC >=1.030 06/29/2024 1123   PHURINE 5.5 06/29/2024 1123   GLUCOSEU NEGATIVE 06/29/2024 1123   HGBUR TRACE (A) 06/29/2024 1123   BILIRUBINUR NEGATIVE 06/29/2024 1123   KETONESUR >=80 (A) 06/29/2024 1123   PROTEINUR NEGATIVE 06/29/2024 1123   UROBILINOGEN 0.2 03/26/2015 1158   NITRITE NEGATIVE 06/29/2024 1123   LEUKOCYTESUR NEGATIVE 06/29/2024 1123   Sepsis Labs Recent Labs  Lab 06/29/24 1007 06/29/24 2139 06/30/24 0610  WBC 16.3* 9.7 6.5    Procedures/Studies: CT ABDOMEN PELVIS W CONTRAST Result Date: 06/29/2024 CLINICAL DATA:  Abdominal pain.  Nausea vomiting and diarrhea. EXAM: CT ABDOMEN AND PELVIS WITH CONTRAST TECHNIQUE: Multidetector CT imaging of the abdomen and pelvis was performed using  the standard protocol following bolus administration of intravenous contrast. RADIATION DOSE REDUCTION: This exam was performed according to the departmental dose-optimization program which includes automated exposure control, adjustment of the  mA and/or kV according to patient size and/or use of iterative reconstruction technique. CONTRAST:  OMNIPAQUE  IOHEXOL  300 MG/ML  SOLN COMPARISON:  CT dated 03/30/2024. FINDINGS: Lower chest: The visualized lung bases are clear. No intra-abdominal free air or free fluid. Hepatobiliary: No focal liver abnormality is seen. No gallstones, gallbladder wall thickening, or biliary dilatation. Pancreas: Unremarkable. No pancreatic ductal dilatation or surrounding inflammatory changes. Spleen: Normal in size without focal abnormality. Adrenals/Urinary Tract: The adrenal glands are unremarkable. The kidneys, visualized ureters, and urinary bladder appear unremarkable. Stomach/Bowel: There is no bowel obstruction or active inflammation. The appendix is normal. Vascular/Lymphatic: Mild aortoiliac atherosclerotic disease. The IVC is unremarkable. No portal venous gas. There is no adenopathy. Reproductive: The uterus is anteverted. Probable 3 cm left fundal fibroid. Small bilateral ovarian cysts or follicles. No imaging follow-up. No suspicious adnexal masses. Other: None Musculoskeletal: No acute or significant osseous findings. IMPRESSION: 1. No acute intra-abdominal or pelvic pathology. 2.  Aortic Atherosclerosis (ICD10-I70.0). Electronically Signed   By: Vanetta Chou M.D.   On: 06/29/2024 13:15    Time coordinating discharge: 60 mins  SIGNED:  Camellia Door, DO [facility] 06/30/24, 3:33 PM

## 2024-06-30 NOTE — Assessment & Plan Note (Signed)
 06-30-2024 admitted for dehydration/acute metabolic acidosis. Given IVF with bicarb. Her acidosis has nearly resolved. No longer having N/V. No diarrhea today. Wants to go home. Agreeable to staying on bland diet at home. Prn zofran , prn phenergan , prn phenergan  supp. Work note provided.

## 2024-06-30 NOTE — Assessment & Plan Note (Signed)
 06-30-2024 no longer having any diarrhea. Stool sample not collected for testing.

## 2024-06-30 NOTE — Hospital Course (Signed)
 HPI: Nicole Mccoy is a 43 y.o. female with medical history significant for fibromyalgia, migraines, Raynaud's syndrome, tachycardia, generalized anxiety disorder and a recent gastroenteritis who presented to med [facility] ED for evaluation of nausea, vomiting and diarrhea.  On Thursday afternoon, patient reports she ate a salad from the salad bar at [employer].  On Friday, she woke up with watery diarrhea that persisted throughout the weekend.  She started having nausea and vomiting on Sunday.  She took Pepto-Bismol pills without relief.  She has continued to have diarrhea, nausea and vomiting since then with inability to keep anything down.  She endorsed burning sensation in her throat and mild abdominal soreness but no abdominal pain, fever, chills, bloody stools, dysuria, dizziness or headache.  Reports that 3 months ago, she had a gastroenteritis from E. coli.   ED Course: Initial vitals show patient afebrile, normotensive but tachycardic with HR in the 110s to 120s. Initial labs significant for sodium 136, K+ 4.9, bicarb 10->17, anion gap 31->19, WBC 16.3, Hgb 15.2, glucose 66, negative flu, RSV and COVID test, UA shows significant ketonuria but no signs of infection, negative UDS and normal ethanol levels. EKG shows sinus tach.  CT A/P with no acute intra-abdominal or pelvic pathology. Pt received IV NS 1 L bolus x 2, IV Protonix , IV Zofran , IV Compazine , Maalox and Xylocaine  viscus suspension, IV Valium , D50 and started on D5 with sodium bicarb infusion.  Patient was admitted to [facility] service and transferred to [facility].  Significant Events: Admitted 06/29/2024 N/V, diarrhea   Admission Labs: WBC 16.3, HgB 15.2, plt 348 Group A strep Negative Covid/flu/rsv negative Lipase 14 Na 136, K 4.9, CL 95, CO2 of 10, BUN 13, scr 0.75, glu 66 T prot 7.5, alb 4.5, AST 141, ALT 88, alk phos 171, t. Bili 0.3 UA trace HgB, ketone > 80, negative nitrite, negative LE UDS negative  Admission  Imaging Studies: CT abd/pelvis No acute intra-abdominal or pelvic pathology. 2.  Aortic Atherosclerosis  Significant Labs:   Significant Imaging Studies:   Antibiotic Therapy: Anti-infectives (From admission, onward)    None       Procedures:   Consultants:

## 2024-06-30 NOTE — Assessment & Plan Note (Signed)
 06-30-2024 stable

## 2024-07-02 ENCOUNTER — Other Ambulatory Visit (HOSPITAL_BASED_OUTPATIENT_CLINIC_OR_DEPARTMENT_OTHER): Payer: Self-pay

## 2024-08-10 ENCOUNTER — Ambulatory Visit
Admission: EM | Admit: 2024-08-10 | Discharge: 2024-08-10 | Disposition: A | Discharge status: Home / Self Care | Payer: Self-pay | Source: Ambulatory Visit | Attending: Family Medicine | Admitting: Family Medicine

## 2024-08-10 ENCOUNTER — Ambulatory Visit (INDEPENDENT_AMBULATORY_CARE_PROVIDER_SITE_OTHER): Payer: Self-pay

## 2024-08-10 DIAGNOSIS — M79641 Pain in right hand: Secondary | ICD-10-CM

## 2024-08-10 DIAGNOSIS — M25531 Pain in right wrist: Secondary | ICD-10-CM

## 2024-08-10 DIAGNOSIS — S60221A Contusion of right hand, initial encounter: Secondary | ICD-10-CM

## 2024-08-10 DIAGNOSIS — S60229A Contusion of unspecified hand, initial encounter: Secondary | ICD-10-CM

## 2024-08-10 MED ORDER — NAPROXEN 375 MG PO TABS: 375.0000 mg | ORAL_TABLET | Freq: Two times a day (BID) | ORAL | 0 refills | Status: AC

## 2024-08-10 NOTE — ED Provider Notes (Signed)
 [facility]  Note:  This document was prepared using Conservation officer, historic buildings and may include unintentional dictation errors.  MRN: [id number] DOB: 05-Jun-1981  Subjective:   Nicole Mccoy is a 43 y.o. female presenting for 1 day history of persistent pain, swelling, bruising of the right hand.  Symptoms started from an injury wherein her large 80 pound dog ran into her hand and crushed it into the doorway inadvertently.  Has used tramadol  and aspirin with minimal relief.  No current facility-administered medications for this encounter.  Current Outpatient Medications:    citalopram  (CELEXA ) 40 MG tablet, Take 40 mg by mouth daily., Disp: , Rfl:    clonazePAM  (KLONOPIN ) 1 MG tablet, Take 1 mg by mouth 2 (two) times daily as needed for anxiety., Disp: , Rfl:    gabapentin  (NEURONTIN ) 300 MG capsule, Take 600 mg by mouth 2 (two) times daily., Disp: , Rfl:    loperamide  (IMODIUM ) 2 MG capsule, Take 1 capsule (2 mg total) by mouth every 6 (six) hours as needed for diarrhea or loose stools. (Patient not taking: Reported on 06/30/2024), Disp: 30 capsule, Rfl: 0   methylphenidate 27 MG PO CR tablet, Take 27 mg by mouth daily., Disp: , Rfl:    promethazine  (PHENERGAN ) 25 MG suppository, Place 1 suppository (25 mg total) rectally every 6 (six) hours as needed for nausea or vomiting., Disp: 12 each, Rfl: 0   promethazine  (PHENERGAN ) 25 MG tablet, Take 1 tablet (25 mg total) by mouth every 6 (six) hours as needed for nausea or vomiting., Disp: 30 tablet, Rfl: 0   traMADol  (ULTRAM ) 50 MG tablet, Take 100 mg by mouth in the morning and at bedtime., Disp: , Rfl:    Allergies  Allergen Reactions   Sulfa Antibiotics Anaphylaxis   Ibuprofen  Other (See Comments)   Lorazepam  Other (See Comments)    -Does not help with symptoms    Past Medical History:  Diagnosis Date   Fibromyalgia    Migraine    Pneumonia    Raynaud's syndrome      Past Surgical History:   Procedure Laterality Date   SHOULDER SURGERY      History reviewed. No pertinent family history.  Social History   Tobacco Use   Smoking status: Former    Types: Cigarettes   Smokeless tobacco: Never  Vaping Use   Vaping status: Some Days  Substance Use Topics   Alcohol use: Yes    Comment: rare   Drug use: No    ROS   Objective:   Vitals: BP 125/80 (BP Location: Left Arm)   Pulse (!) 114   Temp 98.3 F (36.8 C) (Oral)   Resp 16   LMP  (Within Weeks) Comment: 2 weeks  SpO2 96%   Physical Exam Constitutional:      General: She is not in acute distress.    Appearance: Normal appearance. She is well-developed. She is not ill-appearing, toxic-appearing or diaphoretic.  HENT:     Head: Normocephalic and atraumatic.     Nose: Nose normal.     Mouth/Throat:     Mouth: Mucous membranes are moist.  Eyes:     General: No scleral icterus.       Right eye: No discharge.        Left eye: No discharge.     Extraocular Movements: Extraocular movements intact.  Cardiovascular:     Rate and Rhythm: Normal rate.  Pulmonary:     Effort: Pulmonary effort  is normal.  Musculoskeletal:       Hands:  Skin:    General: Skin is warm and dry.  Neurological:     General: No focal deficit present.     Mental Status: She is alert and oriented to person, place, and time.  Psychiatric:        Mood and Affect: Mood normal.        Behavior: Behavior normal.    DG Hand Complete Right Result Date: 08/10/2024 CLINICAL DATA:  Pain and swelling EXAM: RIGHT HAND - COMPLETE 3+ VIEW COMPARISON:  None Available. FINDINGS: There is no evidence of fracture or dislocation. There is no evidence of arthropathy or other focal bone abnormality. Soft tissues are unremarkable. IMPRESSION: Negative. Electronically Signed   By: Megan  Zare M.D.   On: 08/10/2024 18:30   Applied a 2 inch Ace wrap to the right wrist and hand and thumb spica fashion.  Assessment and Plan :   PDMP not reviewed this  encounter.  1. Contusion of hand, initial encounter   2. Acute pain of right wrist   3. Right hand pain    Recommended conservative management for contusion of the right hand, wrist.  Use RICE method, naproxen  for pain and inflammation. Counseled patient on potential for adverse effects with medications prescribed/recommended today, ER and return-to-clinic precautions discussed, patient verbalized understanding.    Christopher Savannah, [place] 08/10/24 [id number]

## 2024-08-10 NOTE — ED Triage Notes (Signed)
 Pt reports pain in the right wrist, pain and swelling in the right hand after her dog jumped over her and the hand and got caught with the door . Tramadol  and aspirin gives no relief.

## 2024-09-19 ENCOUNTER — Emergency Department (HOSPITAL_BASED_OUTPATIENT_CLINIC_OR_DEPARTMENT_OTHER)
Admission: EM | Admit: 2024-09-19 | Discharge: 2024-09-19 | Disposition: A | Discharge status: Home / Self Care | Payer: Self-pay | Attending: Emergency Medicine | Admitting: Emergency Medicine

## 2024-09-19 ENCOUNTER — Other Ambulatory Visit: Payer: Self-pay

## 2024-09-19 ENCOUNTER — Encounter (HOSPITAL_BASED_OUTPATIENT_CLINIC_OR_DEPARTMENT_OTHER): Payer: Self-pay | Admitting: Emergency Medicine

## 2024-09-19 DIAGNOSIS — S01511A Laceration without foreign body of lip, initial encounter: Secondary | ICD-10-CM | POA: Insufficient documentation

## 2024-09-19 DIAGNOSIS — W548XXA Other contact with dog, initial encounter: Secondary | ICD-10-CM | POA: Insufficient documentation

## 2024-09-19 MED ORDER — LIDOCAINE HCL (PF) 1 % IJ SOLN
2.0000 mL | Freq: Once | INTRAMUSCULAR | Status: AC
  Administered 2024-09-19: 2 mL
  Filled 2024-09-19: qty 5

## 2024-09-19 MED ORDER — TETANUS-DIPHTH-ACELL PERTUSSIS 5-2-15.5 LF-MCG/0.5 IM SUSP
0.5000 mL | Freq: Once | INTRAMUSCULAR | Status: AC
  Administered 2024-09-19: 0.5 mL via INTRAMUSCULAR
  Filled 2024-09-19: qty 0.5

## 2024-09-19 NOTE — ED Provider Notes (Signed)
 [facility] AT [facility] Provider Note   CSN: [id number] Arrival date & time: 09/19/24  1247     Patient presents with: Lip Laceration   Nicole Mccoy is a 43 y.o. female with a past medical history of fibromyalgia, migraine, Raynaud's presents to emergency department for evaluation of laceration to inner lower lip following her dog jumping up and accidentally hitting underneath of her chin.  She then bit her inner lip causing a laceration. Unknown last tetanus.  Denies LOC, jaw pain, additional injuries   HPI     Prior to Admission medications   Medication Sig Start Date End Date Taking? Authorizing Provider  citalopram  (CELEXA ) 40 MG tablet Take 40 mg by mouth daily.    [provider]  clonazePAM  (KLONOPIN ) 1 MG tablet Take 1 mg by mouth 2 (two) times daily as needed for anxiety.    [provider]  gabapentin  (NEURONTIN ) 300 MG capsule Take 600 mg by mouth 2 (two) times daily. 03/23/18   [provider]  loperamide  (IMODIUM ) 2 MG capsule Take 1 capsule (2 mg total) by mouth every 6 (six) hours as needed for diarrhea or loose stools. Patient not taking: Reported on 06/30/2024 04/01/24   Arlice Reichert, MD  methylphenidate 27 MG PO CR tablet Take 27 mg by mouth daily. 03/11/24 06/30/24  [provider]  naproxen  (NAPROSYN ) 375 MG tablet Take 1 tablet (375 mg total) by mouth 2 (two) times daily with a meal. 08/10/24   Christopher Savannah, PA-C  promethazine  (PHENERGAN ) 25 MG suppository Place 1 suppository (25 mg total) rectally every 6 (six) hours as needed for nausea or vomiting. 06/30/24   Laurence Locus, DO  promethazine  (PHENERGAN ) 25 MG tablet Take 1 tablet (25 mg total) by mouth every 6 (six) hours as needed for nausea or vomiting. 06/30/24   Laurence Locus, DO  traMADol  (ULTRAM ) 50 MG tablet Take 100 mg by mouth in the morning and at bedtime.    [provider]    Allergies: Sulfa antibiotics, Ibuprofen , and Lorazepam      Review of Systems  Skin:  Positive for wound.    Updated Vital Signs BP (!) 130/117 (BP Location: Right Arm)   Pulse (!) 107   Temp 98.4 F (36.9 C)   Resp 17   Ht 5' 4 (1.626 m)   Wt 70.3 kg   LMP 09/17/2024 (Approximate)   SpO2 98%   BMI 26.61 kg/m   Physical Exam Vitals and nursing note reviewed.  Constitutional:      General: She is not in acute distress.    Appearance: Normal appearance.  HENT:     Head: Normocephalic and atraumatic. No raccoon eyes or Battle's sign.     Jaw: No trismus, tenderness or pain on movement.     Mouth/Throat:     Mouth: Mucous membranes are moist. Lacerations present.     Dentition: No dental tenderness.     Comments: 0.5cm avulsion to inner lower lip with exposed adipose tissue. No drainage nor hemorrhage. No laceration to tongue or cheeks. Eyes:     General: Vision grossly intact. No visual field deficit.    Extraocular Movements:     Right eye: Normal extraocular motion and no nystagmus.     Left eye: Normal extraocular motion and no nystagmus.     Conjunctiva/sclera: Conjunctivae normal.     Comments: No tear drop pupil, subconjunctival hemorrhage. No EOM entrapment nor pain with EOM  Cardiovascular:     Rate  and Rhythm: Normal rate.  Pulmonary:     Effort: Pulmonary effort is normal. No respiratory distress.  Skin:    Coloration: Skin is not jaundiced or pale.  Neurological:     Mental Status: She is alert. Mental status is at baseline.     GCS: GCS eye subscore is 4. GCS verbal subscore is 5. GCS motor subscore is 6.     Cranial Nerves: No dysarthria or facial asymmetry.     Motor: No pronator drift.     Gait: Gait is intact.     Comments: A&Ox3.  Following commands appropriately.  Grip strength equal.  Sensation 2/2 BUE and BLE. No facial sensory deficits     (all labs ordered are listed, but only abnormal results are displayed) Labs Reviewed - No data to display  EKG: None  Radiology: No results  found.   .Laceration Repair  Date/Time: 09/19/2024 3:06 PM  Performed by: Minnie Tinnie BRAVO, PA Authorized by: Minnie Tinnie BRAVO, PA   Consent:    Consent obtained:  Verbal   Consent given by:  Patient   Risks, benefits, and alternatives were discussed: yes     Risks discussed:  Infection, poor cosmetic result, poor wound healing, nerve damage and need for additional repair   Alternatives discussed:  No treatment Universal protocol:    Patient identity confirmed:  Verbally with patient and arm band Anesthesia:    Anesthesia method:  Local infiltration   Local anesthetic:  Lidocaine  1% w/o epi Laceration details:    Location:  Lip   Lip location:  Lower interior lip   Length (cm):  0.5 Treatment:    Area cleansed with:  Saline   Amount of cleaning:  Standard   Irrigation solution:  Sterile saline   Irrigation volume:  500 Skin repair:    Repair method:  Sutures   Suture size:  5-0   Suture material:  Fast-absorbing gut   Suture technique:  Simple interrupted   Number of sutures:  2 Approximation:    Approximation:  Close Repair type:    Repair type:  Simple Post-procedure details:    Dressing:  Open (no dressing)   Procedure completion:  Tolerated well, no immediate complications    Medications Ordered in the ED  lidocaine  (PF) (XYLOCAINE ) 1 % injection 2 mL (2 mLs Other Given by Other 09/19/24 1446)  Tdap (ADACEL) injection 0.5 mL (0.5 mLs Intramuscular Given 09/19/24 1445)                                    Medical Decision Making Risk Prescription drug management.   Patient presents to the ED for concern of lip laceration, this involves an extensive number of treatment options, and is a complaint that carries with it a high risk of complications and morbidity.  The differential diagnosis includes laceration, infection, fracture   Co morbidities that complicate the patient evaluation  None   Additional history obtained:  Additional history obtained from  Nursing   External records from outside source obtained and reviewed including triage RN note    Medicines ordered and prescription drug management:  I ordered medication including lido wo epi  for lac  Reevaluation of the patient after these medicines showed that the patient improved I have reviewed the patients home medicines and have made adjustments as needed    Problem List / ED Course:  Lip laceration Did offer Tylenol , ibuprofen   however patient refused Irrigated wound with saline Updated tetanus Suture repair as noted above Sutures to dissolve Discussed return precautions and symptomatic treatment.  Also provided recommendations on DC paperwork   Reevaluation:  After the interventions noted above, I reevaluated the patient and found that they have :improved    Dispostion:  After consideration of the diagnostic results and the patients response to treatment, I feel that the patent would benefit from outpatient management with symptomatic treatment.   Discussed ED workup, disposition, return to ED precautions with patient who expresses understanding agrees with plan.  All questions answered to their satisfaction.  They are agreeable to plan.  Discharge instructions provided on paperwork  Final diagnoses:  Lip laceration, initial encounter    ED Discharge Orders     None        Minnie Tinnie BRAVO, PA 09/19/24 1511    Dreama Longs, MD 09/20/24 1135

## 2024-09-19 NOTE — Discharge Instructions (Signed)
 Thank you for letting us  evaluate you today.  Your sutures should dissolve.  If they do not you can get them cut out.  Sutures should be in at least 7-10 days  I would recommend soft foods.  You may use Tylenol , ibuprofen  as needed for pain.   Return to Emergency Department if you had bleeding that is unable to be stopped with direct pressure, significant swelling with drainage to indicate infection

## 2024-09-19 NOTE — ED Triage Notes (Signed)
 Pt caox4 ambulatory c/o laceration to lower lip from her dog jumping up knocking her in the face this morning.

## 2024-09-19 NOTE — ED Notes (Signed)

## 2024-09-20 ENCOUNTER — Ambulatory Visit: Payer: Self-pay

## 2024-09-20 NOTE — Telephone Encounter (Signed)
 Copied from CRM #[id number]. Topic: Clinical - Medical Advice >> Sep 20, 2024 12:21 PM Delon HERO wrote: Reason for CRM: Patient is calling to ask that she went to the ED yesterday for Lip laceration, initial encounter - all stiches have come out No bleeding. Reason for Disposition  Minor cut or scratch  Answer Assessment - Initial Assessment Questions Pt to went to the ER and was sutured at that time, some of them fall out on the way home yesterday and the other fell out today. She states the ER doctor told her this wasn't her specialty and wasn't sure how it would turn out. She states the doctor also told her to look at herself in the phone camera to see if she (the patient) needed another suture. RN reviewed way to keep the area clean and signs and symptoms of infection and when to return to the ER. Pt stated understanding.  Protocols used: Cuts and Lacerations-A-AH

## 2024-10-15 ENCOUNTER — Ambulatory Visit
Admission: EM | Admit: 2024-10-15 | Discharge: 2024-10-15 | Disposition: A | Discharge status: Home / Self Care | Payer: Self-pay | Attending: Family Medicine | Admitting: Family Medicine

## 2024-10-15 DIAGNOSIS — H66003 Acute suppurative otitis media without spontaneous rupture of ear drum, bilateral: Secondary | ICD-10-CM

## 2024-10-15 MED ORDER — AMOXICILLIN-POT CLAVULANATE 875-125 MG PO TABS: 1.0000 | ORAL_TABLET | Freq: Two times a day (BID) | ORAL | 0 refills | Status: AC

## 2024-10-15 NOTE — ED Triage Notes (Signed)
 Pt present with c/o bilateral ear pain x one week. States she has taken Sudafed. States the rt ear has been draining.

## 2024-10-15 NOTE — ED Provider Notes (Signed)
 [facility]    CSN: [id number] Arrival date & time: 10/15/24  1027      History   Chief Complaint Chief Complaint  Patient presents with   Ear Pain    HPI Nicole Mccoy is a 43 y.o. female.   Patient is here for bilateral ear pain for 1 week. She is having drainage from the ear, but the right is worse.  No fevers/chills.  She is having runny nose, congestion and cough.  Using sudafed without much help.  She does have h/o recurrent ear infection.         Past Medical History:  Diagnosis Date   Fibromyalgia    Migraine    Pneumonia    Raynaud's syndrome     Patient Active Problem List   Diagnosis Date Noted   Starvation ketoacidosis 06/30/2024   Acute infectious diarrhea 06/30/2024   Intractable nausea and vomiting 06/30/2024   Hypokalemia 06/30/2024   Severe dehydration 06/29/2024   High anion gap metabolic acidosis 03/30/2024   Acidosis 03/30/2024   Fibroid 03/30/2024   Elevated LFTs 03/30/2024   Acute gastroenteritis 03/30/2024   Chronic tachycardia 03/30/2024   Anxiety 03/30/2024   Rash 04/10/2012   Fibromyalgia 04/10/2012   Migraine 04/10/2012    Past Surgical History:  Procedure Laterality Date   SHOULDER SURGERY      OB History   No obstetric history on file.      Home Medications    Prior to Admission medications   Medication Sig Start Date End Date Taking? Authorizing Provider  citalopram  (CELEXA ) 40 MG tablet Take 40 mg by mouth daily.    [provider]  clonazePAM  (KLONOPIN ) 1 MG tablet Take 1 mg by mouth 2 (two) times daily as needed for anxiety.    [provider]  gabapentin  (NEURONTIN ) 300 MG capsule Take 600 mg by mouth 2 (two) times daily. 03/23/18   [provider]  loperamide  (IMODIUM ) 2 MG capsule Take 1 capsule (2 mg total) by mouth every 6 (six) hours as needed for diarrhea or loose stools. Patient not taking: Reported on 06/30/2024 04/01/24   Arlice Reichert, MD  methylphenidate 27 MG  PO CR tablet Take 27 mg by mouth daily. 03/11/24 06/30/24  [provider]  naproxen  (NAPROSYN ) 375 MG tablet Take 1 tablet (375 mg total) by mouth 2 (two) times daily with a meal. 08/10/24   Christopher Savannah, PA-C  promethazine  (PHENERGAN ) 25 MG suppository Place 1 suppository (25 mg total) rectally every 6 (six) hours as needed for nausea or vomiting. 06/30/24   Laurence Locus, DO  promethazine  (PHENERGAN ) 25 MG tablet Take 1 tablet (25 mg total) by mouth every 6 (six) hours as needed for nausea or vomiting. 06/30/24   Laurence Locus, DO  traMADol  (ULTRAM ) 50 MG tablet Take 100 mg by mouth in the morning and at bedtime.    [provider]    Family History History reviewed. No pertinent family history.  Social History Social History   Tobacco Use   Smoking status: Former    Types: Cigarettes   Smokeless tobacco: Never  Vaping Use   Vaping status: Some Days  Substance Use Topics   Alcohol use: Yes    Comment: rare   Drug use: No     Allergies   Sulfa antibiotics, Ibuprofen , and Lorazepam    Review of Systems Review of Systems  Constitutional:  Positive for fatigue.  HENT:  Positive for congestion, ear discharge, ear pain and rhinorrhea.  Respiratory: Negative.    Cardiovascular: Negative.   Gastrointestinal: Negative.   Musculoskeletal: Negative.      Physical Exam Triage Vital Signs ED Triage Vitals  Encounter Vitals Group     BP 10/15/24 1052 (!) 140/97     Girls Systolic BP Percentile --      Girls Diastolic BP Percentile --      Boys Systolic BP Percentile --      Boys Diastolic BP Percentile --      Pulse Rate 10/15/24 1050 (!) 115     Resp 10/15/24 1050 16     Temp 10/15/24 1050 98.5 F (36.9 C)     Temp src --      SpO2 10/15/24 1050 96 %     Weight --      Height --      Head Circumference --      Peak Flow --      Pain Score 10/15/24 1050 2     Pain Loc --      Pain Education --      Exclude from Growth Chart --    No data found.  Updated  Vital Signs BP (!) 140/97   Pulse (!) 115   Temp 98.5 F (36.9 C)   Resp 16   LMP 09/17/2024 (Approximate)   SpO2 96%   Visual Acuity Right Eye Distance:   Left Eye Distance:   Bilateral Distance:    Right Eye Near:   Left Eye Near:    Bilateral Near:     Physical Exam Constitutional:      General: She is not in acute distress.    Appearance: Normal appearance. She is normal weight. She is not ill-appearing or toxic-appearing.  HENT:     Right Ear: External ear normal. No drainage. A middle ear effusion is present. Tympanic membrane is injected.     Left Ear: External ear normal. No drainage. A middle ear effusion is present.     Nose:     Right Sinus: Maxillary sinus tenderness present.     Left Sinus: Maxillary sinus tenderness present.     Mouth/Throat:     Mouth: Mucous membranes are moist.     Pharynx: No posterior oropharyngeal erythema.  Cardiovascular:     Rate and Rhythm: Normal rate and regular rhythm.  Pulmonary:     Effort: Pulmonary effort is normal.     Breath sounds: Normal breath sounds.  Musculoskeletal:     Cervical back: Normal range of motion and neck supple. Tenderness present.  Lymphadenopathy:     Cervical: No cervical adenopathy.  Neurological:     General: No focal deficit present.     Mental Status: She is alert.  Psychiatric:        Mood and Affect: Mood normal.      UC Treatments / Results  Labs (all labs ordered are listed, but only abnormal results are displayed) Labs Reviewed - No data to display  EKG   Radiology No results found.  Procedures Procedures (including critical care time)  Medications Ordered in UC Medications - No data to display  Initial Impression / Assessment and Plan / UC Course  I have reviewed the triage vital signs and the nursing notes.  Pertinent labs & imaging results that were available during my care of the patient were reviewed by me and considered in my medical decision making (see chart for  details).  Final Clinical Impressions(s) / UC Diagnoses   Final diagnoses:  Non-recurrent acute suppurative otitis media of both ears without spontaneous rupture of tympanic membranes     Discharge Instructions      You were seen today for an ear infection. I have sent out an oral antibiotic to take twice/day x 10 days.  You may continue sudafed.  I do recommend flonase nasal spray since you cannot take oral steroids.  If you have worsening symptoms then please return for re-evaluation.      ED Prescriptions     Medication Sig Dispense Auth. Provider   amoxicillin-clavulanate (AUGMENTIN) 875-125 MG tablet Take 1 tablet by mouth every 12 (twelve) hours for 10 days. 20 tablet Darral Longs, MD      PDMP not reviewed this encounter.   Darral Longs, MD 10/15/24 1109

## 2024-10-15 NOTE — Discharge Instructions (Signed)
 You were seen today for an ear infection. I have sent out an oral antibiotic to take twice/day x 10 days.  You may continue sudafed.  I do recommend flonase nasal spray since you cannot take oral steroids.  If you have worsening symptoms then please return for re-evaluation.

## 2024-11-09 ENCOUNTER — Ambulatory Visit
Admission: RE | Admit: 2024-11-09 | Discharge: 2024-11-09 | Disposition: A | Discharge status: Home / Self Care | Payer: Self-pay

## 2024-11-09 ENCOUNTER — Ambulatory Visit: Payer: Self-pay

## 2024-11-09 VITALS — BP 148/78 | HR 117 | Temp 98.1°F | Resp 17 | Wt 155.0 lb

## 2024-11-09 DIAGNOSIS — H60391 Other infective otitis externa, right ear: Secondary | ICD-10-CM

## 2024-11-09 MED ORDER — CIPROFLOXACIN-DEXAMETHASONE 0.3-0.1 % OT SUSP: 4.0000 [drp] | Freq: Two times a day (BID) | OTIC | 0 refills | Status: DC

## 2024-11-09 NOTE — Discharge Instructions (Addendum)
 You were seen today for concerns of right ear pain.  Your physical exam is consistent with an infection in your ear canal also known as otitis externa.  I have sent to medication called Ciprodex  for you to instill in the ear twice per day for 7 days Please try to avoid getting the ear canal read or submerging your head in water as this can cause further infection and contamination in the area If you feel like your symptoms are not improving or seem to be worsening you can return here or follow-up with your PCP for further evaluation If you develop any of the following please return to urgent care or the emergency room as these could be signs of worsening infection: Severe pain of the ear, pain in the surrounding areas such as behind the ear or the jaw, fevers, displacement of the ear due to swelling, severe headaches, copious amounts of drainage and blood from the ear   VISIT SUMMARY:  You came in today with concerns about your right ear, right knee, and a stye on your right lower eyelid. You also had questions about your daughter's recent exposure to chlamydia and her treatment.  YOUR PLAN:  -OTITIS EXTERNA, RIGHT EAR: Otitis externa is an infection of the outer ear canal. You have been prescribed antibiotic ear drops with a mild steroid to use twice daily for seven days. You should also alternate acetaminophen  and ibuprofen  as needed for pain. Continue to avoid getting water in your ear, and use cotton balls with Vaseline during showers if necessary.  -RIGHT KNEE CONTUSION WITH EFFUSION: A knee contusion with effusion means you have a bruise and fluid buildup in your knee. You should use warm compresses for relief and wear a knee brace for stabilization. If the swelling does not improve, you may need to contact an orthopedic specialist for potential fluid drainage.  -HORDEOLUM, RIGHT LOWER EYELID: A hordeolum, or stye, is a small, painful lump on the eyelid caused by a blocked gland. Continue  using warm compresses to help it heal.  INSTRUCTIONS:  Please follow up with an orthopedic specialist if your knee swelling does not improve. Continue to monitor your ear and eye conditions, and return if symptoms worsen or do not improve with treatment.

## 2024-11-09 NOTE — ED Triage Notes (Signed)
 She was seen for her ear and was prescribed her abx which She did not finish. She states she has been having ear itching, a lot of right ear drainage, and some external ear swelling, and right sided muffled hearing.

## 2024-11-09 NOTE — ED Provider Notes (Signed)
 [facility]    CSN: [id number] Arrival date & time: 11/09/24  1804      History   Chief Complaint Chief Complaint  Patient presents with   Ear Drainage    I also need an injured knee checked. - Entered by patient    HPI Nicole Mccoy is a 43 y.o. female.  has a past medical history of Fibromyalgia, Migraine, Pneumonia, and Raynaud's syndrome.   HPI  Discussed the use of AI scribe software for clinical note transcription with the patient, who gave verbal consent to proceed.  The patient presents with right ear issues, including hearing changes and drainage.  The patient has been experiencing changes in hearing in the right ear since earlier today, with a noted worsening of hearing. The ear is swollen with a lot of malodorous drainage. There is no significant pain, but discomfort is noted when lying on the affected ear. She completed part of a course of Augmentin  for this issue but stopped due to a stomach virus and concerns about gastrointestinal upset. She has been avoiding getting water in the ear by washing her hair over the side of the bathtub and covering her ears during showers. No fever or chills reported.  She sustained a knee injury on Saturday night after falling due to her dogs tripping her. The patient reports a large bruise on the thigh and knee, with the knee feeling 'weird' and having been injured multiple times in the past month. Arnica has been used for pain and bruising, providing relief. The knee is more swollen at night, and discomfort is experienced with certain movements. She works from home, which limits her walking.  Additionally, she mentions a stye on the bottom eyelid that has been swollen since Sunday. Warm compresses have been used, and the soreness has decreased.  She also inquires about her daughter, who was exposed to chlamydia and had difficulty tolerating doxycycline  due to vomiting. The daughter was given an alternative treatment of two  grams of azithromycin .   Past Medical History:  Diagnosis Date   Fibromyalgia    Migraine    Pneumonia    Raynaud's syndrome     Patient Active Problem List   Diagnosis Date Noted   Starvation ketoacidosis 06/30/2024   Acute infectious diarrhea 06/30/2024   Intractable nausea and vomiting 06/30/2024   Hypokalemia 06/30/2024   Severe dehydration 06/29/2024   High anion gap metabolic acidosis 03/30/2024   Acidosis 03/30/2024   Fibroid 03/30/2024   Elevated LFTs 03/30/2024   Acute gastroenteritis 03/30/2024   Chronic tachycardia 03/30/2024   Anxiety 03/30/2024   Rash 04/10/2012   Fibromyalgia 04/10/2012   Migraine 04/10/2012    Past Surgical History:  Procedure Laterality Date   SHOULDER SURGERY      OB History   No obstetric history on file.      Home Medications    Prior to Admission medications   Medication Sig Start Date End Date Taking? Authorizing Provider  ciprofloxacin -dexamethasone  (CIPRODEX ) OTIC suspension Place 4 drops into the right ear 2 (two) times daily for 7 days. 11/09/24 11/16/24 Yes Katherinne Mofield E, PA-C  citalopram  (CELEXA ) 40 MG tablet Take 40 mg by mouth daily.   Yes [provider]  clonazePAM  (KLONOPIN ) 1 MG tablet Take 1 mg by mouth 2 (two) times daily as needed for anxiety.   Yes [provider]  gabapentin  (NEURONTIN ) 300 MG capsule Take 600 mg by mouth 2 (two) times daily. 03/23/18  Yes [provider]  methylphenidate 27 MG PO CR tablet Take 27 mg by mouth daily. 03/11/24 11/09/24 Yes [provider]  naloxone (NARCAN) nasal spray 4 mg/0.1 mL Place 1 spray into the nose. 10/11/24 11/10/24 Yes [provider]  promethazine  (PHENERGAN ) 25 MG tablet Take 1 tablet (25 mg total) by mouth every 6 (six) hours as needed for nausea or vomiting. 06/30/24  Yes Laurence Locus, DO  traMADol  (ULTRAM ) 50 MG tablet Take 100 mg by mouth in the morning and at bedtime.   Yes [provider]  loperamide  (IMODIUM ) 2  MG capsule Take 1 capsule (2 mg total) by mouth every 6 (six) hours as needed for diarrhea or loose stools. Patient not taking: Reported on 06/30/2024 04/01/24   Arlice Reichert, MD  naproxen  (NAPROSYN ) 375 MG tablet Take 1 tablet (375 mg total) by mouth 2 (two) times daily with a meal. Patient not taking: Reported on 11/09/2024 08/10/24   Christopher Savannah, PA-C  promethazine  (PHENERGAN ) 25 MG suppository Place 1 suppository (25 mg total) rectally every 6 (six) hours as needed for nausea or vomiting. Patient not taking: Reported on 11/09/2024 06/30/24   Laurence Locus, DO    Family History History reviewed. No pertinent family history.  Social History Social History   Tobacco Use   Smoking status: Former    Types: Cigarettes   Smokeless tobacco: Never  Vaping Use   Vaping status: Some Days  Substance Use Topics   Alcohol use: Yes    Comment: rare   Drug use: No     Allergies   Sulfa antibiotics, Ibuprofen , and Lorazepam    Review of Systems Review of Systems  Constitutional:  Negative for chills and fever.  HENT:  Positive for ear discharge and hearing loss. Negative for ear pain.        Outer ear swelling      Physical Exam Triage Vital Signs ED Triage Vitals  Encounter Vitals Group     BP 11/09/24 1820 (!) 148/78     Girls Systolic BP Percentile --      Girls Diastolic BP Percentile --      Boys Systolic BP Percentile --      Boys Diastolic BP Percentile --      Pulse Rate 11/09/24 1820 (!) 117     Resp 11/09/24 1820 17     Temp 11/09/24 1820 98.1 F (36.7 C)     Temp Source 11/09/24 1820 Oral     SpO2 11/09/24 1820 96 %     Weight 11/09/24 1820 155 lb (70.3 kg)     Height --      Head Circumference --      Peak Flow --      Pain Score 11/09/24 1831 0     Pain Loc --      Pain Education --      Exclude from Growth Chart --    No data found.  Updated Vital Signs BP (!) 148/78 (BP Location: Right Arm)   Pulse (!) 117 Comment: Patient states this is around her normal   Temp 98.1 F (36.7 C) (Oral)   Resp 17   Wt 155 lb (70.3 kg)   LMP 10/19/2024 (Approximate)   SpO2 96%   BMI 26.61 kg/m   Visual Acuity Right Eye Distance:   Left Eye Distance:   Bilateral Distance:    Right Eye Near:   Left Eye Near:    Bilateral Near:     Physical Exam Vitals reviewed.  Constitutional:  General: She is awake.     Appearance: Normal appearance. She is well-developed and well-groomed.  HENT:     Head: Normocephalic and atraumatic.     Right Ear: Hearing and tympanic membrane normal. Drainage, swelling and tenderness present. No middle ear effusion. There is no impacted cerumen. No foreign body. Tympanic membrane is not injected, scarred, perforated, erythematous, retracted or bulging.     Left Ear: Hearing, tympanic membrane, ear canal and external ear normal.  Eyes:     General: Lids are normal. Gaze aligned appropriately.        Right eye: Hordeolum present.     Extraocular Movements: Extraocular movements intact.     Conjunctiva/sclera: Conjunctivae normal.  Pulmonary:     Effort: Pulmonary effort is normal.  Musculoskeletal:     Right knee: Swelling and effusion present. No bony tenderness. Normal range of motion. Tenderness present over the medial joint line and lateral joint line. No LCL laxity, MCL laxity, ACL laxity or PCL laxity. Normal alignment and normal meniscus.     Instability Tests: Anterior drawer test negative. Posterior drawer test negative. Medial McMurray test negative and lateral McMurray test negative.     Comments: Pt has pain with Apley grind testing on the right knee  Neurological:     Mental Status: She is alert and oriented to person, place, and time.  Psychiatric:        Attention and Perception: Attention and perception normal.        Mood and Affect: Mood and affect normal.        Speech: Speech normal.        Behavior: Behavior normal. Behavior is cooperative.      UC Treatments / Results  Labs (all labs ordered  are listed, but only abnormal results are displayed) Labs Reviewed - No data to display  EKG   Radiology No results found.  Procedures Procedures (including critical care time)  Medications Ordered in UC Medications - No data to display  Initial Impression / Assessment and Plan / UC Course  I have reviewed the triage vital signs and the nursing notes.  Pertinent labs & imaging results that were available during my care of the patient were reviewed by me and considered in my medical decision making (see chart for details).      Final Clinical Impressions(s) / UC Diagnoses   Final diagnoses:  Infective otitis externa of right ear  Otitis externa, right ear Chronic otitis externa in the right ear with persistent drainage and swelling for over a week. Previous Augmentin  course was incomplete due to gastrointestinal side effects. Examination reveals an inner ear infection and otitis externa with canal inflammation and drainage. No significant pain reported, but discomfort and irritation present. - Prescribed antibiotic ear drops with mild steroid, to be used twice daily for seven days. - Advised alternating acetaminophen  and ibuprofen  as needed for pain. - Instructed to avoid water exposure in the ear, using cotton balls with Vaseline if necessary during showers.  Right knee contusion with effusion Recurrent right knee contusion with effusion following a fall. Bruising and swelling present, with increased discomfort at night. No signs of fracture or dislocation. Possible fluid accumulation noted, with some improvement in swelling. - Provided contact information for orthopedic offices for potential fluid drainage. - Recommended warm compresses for symptom relief. - Applied knee brace for stabilization.  Hordeolum, right lower eyelid Hordeolum on the right lower eyelid with improvement in soreness. - Continue warm compresses to aid resolution.  Discharge Instructions       You were seen today for concerns of right ear pain.  Your physical exam is consistent with an infection in your ear canal also known as otitis externa.  I have sent to medication called Ciprodex  for you to instill in the ear twice per day for 7 days Please try to avoid getting the ear canal read or submerging your head in water as this can cause further infection and contamination in the area If you feel like your symptoms are not improving or seem to be worsening you can return here or follow-up with your PCP for further evaluation If you develop any of the following please return to urgent care or the emergency room as these could be signs of worsening infection: Severe pain of the ear, pain in the surrounding areas such as behind the ear or the jaw, fevers, displacement of the ear due to swelling, severe headaches, copious amounts of drainage and blood from the ear   VISIT SUMMARY:  You came in today with concerns about your right ear, right knee, and a stye on your right lower eyelid. You also had questions about your daughter's recent exposure to chlamydia and her treatment.  YOUR PLAN:  -OTITIS EXTERNA, RIGHT EAR: Otitis externa is an infection of the outer ear canal. You have been prescribed antibiotic ear drops with a mild steroid to use twice daily for seven days. You should also alternate acetaminophen  and ibuprofen  as needed for pain. Continue to avoid getting water in your ear, and use cotton balls with Vaseline during showers if necessary.  -RIGHT KNEE CONTUSION WITH EFFUSION: A knee contusion with effusion means you have a bruise and fluid buildup in your knee. You should use warm compresses for relief and wear a knee brace for stabilization. If the swelling does not improve, you may need to contact an orthopedic specialist for potential fluid drainage.  -HORDEOLUM, RIGHT LOWER EYELID: A hordeolum, or stye, is a small, painful lump on the eyelid caused by a blocked gland. Continue  using warm compresses to help it heal.  INSTRUCTIONS:  Please follow up with an orthopedic specialist if your knee swelling does not improve. Continue to monitor your ear and eye conditions, and return if symptoms worsen or do not improve with treatment.     ED Prescriptions     Medication Sig Dispense Auth. Provider   ciprofloxacin -dexamethasone  (CIPRODEX ) OTIC suspension Place 4 drops into the right ear 2 (two) times daily for 7 days. 2.8 mL Tige Meas E, PA-C      PDMP not reviewed this encounter.   Marylene Rocky FORBES DEVONNA 11/09/24 2007

## 2024-11-10 ENCOUNTER — Telehealth: Payer: Self-pay | Admitting: Family Medicine

## 2024-11-10 MED ORDER — OFLOXACIN 0.3 % OT SOLN: 5.0000 [drp] | Freq: Two times a day (BID) | OTIC | 0 refills | Status: AC

## 2024-11-10 NOTE — Telephone Encounter (Signed)
 Patient called stating the ciprodex  was too expensive.  Requesting floxin otic;  this was sent to the pharmacy.

## 2024-12-07 ENCOUNTER — Ambulatory Visit
Admission: EM | Admit: 2024-12-07 | Discharge: 2024-12-07 | Disposition: A | Discharge status: Home / Self Care | Payer: Self-pay | Attending: Physician Assistant | Admitting: Physician Assistant

## 2024-12-07 ENCOUNTER — Other Ambulatory Visit: Payer: Self-pay

## 2024-12-07 DIAGNOSIS — R2232 Localized swelling, mass and lump, left upper limb: Secondary | ICD-10-CM

## 2024-12-07 DIAGNOSIS — E041 Nontoxic single thyroid nodule: Secondary | ICD-10-CM

## 2024-12-07 NOTE — ED Triage Notes (Signed)
 Pt presents to urgent care with c/o lump on anterior neck and on left axilla. Noticed about five days ago when several family members/friends said her neck looked swollen. Began to feel around and felt two lumps. Concerned about her thyroid. States she is gaining weight and losing her hair. No pain concerning these complaints. Has fibromyalgia. Explains she is in chronic pain.

## 2024-12-07 NOTE — ED Provider Notes (Signed)
 [facility]    CSN: [id number] Arrival date & time: 12/07/24  1759      History   Chief Complaint Chief Complaint  Patient presents with   Medical Problem     HPI TIFFENY MINCHEW is a 44 y.o. female.   HPI  Pt is here today with concerns of a lump along her anterior neck. She states she has been gaining weight, losing hair and is concerned for a thyroid issues. She reports about 4 days ago she noticed a lump under her left arm and is concerned for potential lymph node swelling. She reports she is also having some GI concerns as well.    Past Medical History:  Diagnosis Date   Fibromyalgia    Migraine    Pneumonia    Raynaud's syndrome     Patient Active Problem List   Diagnosis Date Noted   Starvation ketoacidosis 06/30/2024   Acute infectious diarrhea 06/30/2024   Intractable nausea and vomiting 06/30/2024   Hypokalemia 06/30/2024   Severe dehydration 06/29/2024   High anion gap metabolic acidosis 03/30/2024   Acidosis 03/30/2024   Fibroid 03/30/2024   Elevated LFTs 03/30/2024   Acute gastroenteritis 03/30/2024   Chronic tachycardia 03/30/2024   Anxiety 03/30/2024   Rash 04/10/2012   Fibromyalgia 04/10/2012   Migraine 04/10/2012    Past Surgical History:  Procedure Laterality Date   SHOULDER SURGERY      OB History   No obstetric history on file.      Home Medications    Prior to Admission medications  Medication Sig Start Date End Date Taking? Authorizing Provider  citalopram  (CELEXA ) 40 MG tablet Take 40 mg by mouth daily.    [provider]  clonazePAM  (KLONOPIN ) 1 MG tablet Take 1 mg by mouth 2 (two) times daily as needed for anxiety.    [provider]  gabapentin  (NEURONTIN ) 300 MG capsule Take 600 mg by mouth 2 (two) times daily. 03/23/18   [provider]  loperamide  (IMODIUM ) 2 MG capsule Take 1 capsule (2 mg total) by mouth every 6 (six) hours as needed for diarrhea or loose stools. Patient not  taking: Reported on 06/30/2024 04/01/24   Arlice Reichert, MD  methylphenidate 27 MG PO CR tablet Take 27 mg by mouth daily. 03/11/24 11/09/24  [provider]  naproxen  (NAPROSYN ) 375 MG tablet Take 1 tablet (375 mg total) by mouth 2 (two) times daily with a meal. Patient not taking: Reported on 11/09/2024 08/10/24   Christopher Savannah, PA-C  promethazine  (PHENERGAN ) 25 MG suppository Place 1 suppository (25 mg total) rectally every 6 (six) hours as needed for nausea or vomiting. Patient not taking: Reported on 11/09/2024 06/30/24   Laurence Locus, DO  promethazine  (PHENERGAN ) 25 MG tablet Take 1 tablet (25 mg total) by mouth every 6 (six) hours as needed for nausea or vomiting. 06/30/24   Laurence Locus, DO  traMADol  (ULTRAM ) 50 MG tablet Take 100 mg by mouth in the morning and at bedtime.    [provider]    Family History History reviewed. No pertinent family history.  Social History Social History[1]   Allergies   Sulfa antibiotics, Ibuprofen , and Lorazepam    Review of Systems Review of Systems  Hematological:  Positive for adenopathy.     Physical Exam Triage Vital Signs ED Triage Vitals  Encounter Vitals Group     BP 12/07/24 1841 (!) 135/95     Girls Systolic BP Percentile --  Girls Diastolic BP Percentile --      Boys Systolic BP Percentile --      Boys Diastolic BP Percentile --      Pulse Rate 12/07/24 1841 (!) 112     Resp 12/07/24 1841 17     Temp 12/07/24 1841 97.7 F (36.5 C)     Temp Source 12/07/24 1841 Oral     SpO2 12/07/24 1841 98 %     Weight --      Height 12/07/24 1844 5' 4 (1.626 m)     Head Circumference --      Peak Flow --      Pain Score 12/07/24 1843 0     Pain Loc --      Pain Education --      Exclude from Growth Chart --    No data found.  Updated Vital Signs BP (!) 135/95 (BP Location: Right Arm)   Pulse (!) 112   Temp 97.7 F (36.5 C) (Oral)   Resp 17   Ht 5' 4 (1.626 m)   LMP 11/15/2024 (Approximate)   SpO2 98%   BMI 26.61  kg/m   Visual Acuity Right Eye Distance:   Left Eye Distance:   Bilateral Distance:    Right Eye Near:   Left Eye Near:    Bilateral Near:     Physical Exam Vitals reviewed.  Constitutional:      General: She is awake. She is not in acute distress.    Appearance: Normal appearance. She is well-developed and well-groomed. She is not ill-appearing, toxic-appearing or diaphoretic.  HENT:     Head: Normocephalic and atraumatic.  Eyes:     General: Lids are normal. Gaze aligned appropriately.     Extraocular Movements: Extraocular movements intact.     Conjunctiva/sclera: Conjunctivae normal.  Neck:     Thyroid: Thyroid mass present. No thyromegaly or thyroid tenderness.     Trachea: Trachea and phonation normal.   Pulmonary:     Effort: Pulmonary effort is normal.  Chest:       Comments: Pt has mobile, tender lump in the left axilla approx 2 cm diameter  Musculoskeletal:     Cervical back: Normal range of motion and neck supple.  Neurological:     Mental Status: She is alert and oriented to person, place, and time.  Psychiatric:        Attention and Perception: Attention and perception normal.        Mood and Affect: Mood and affect normal.        Speech: Speech normal.        Behavior: Behavior normal. Behavior is cooperative.      UC Treatments / Results  Labs (all labs ordered are listed, but only abnormal results are displayed) Labs Reviewed  TSH   Narrative:    Performed at:  [address] [place], [place]  [id number] Lab Director: Frankey Sas MD, Phone:  [phone]    EKG   Radiology No results found.  Procedures Procedures (including critical care time)  Medications Ordered in UC Medications - No data to display  Initial Impression / Assessment and Plan / UC Course  I have reviewed the triage vital signs and the nursing notes.  Pertinent labs & imaging results that were available during my care of the patient were  reviewed by me and considered in my medical decision making (see chart for details).      Final Clinical Impressions(s) / UC  Diagnoses   Final diagnoses:  Thyroid nodule  Mass of left axilla   Patient is here today with concerns of a lump or bump along the anterior portion of her neck as well as is a lump under her left arm.  With regards to the 1 on her neck she is worried about potential thyroid involvement stating that she has been having some symptoms of thyroid illness such as weight gain, hair loss, GI symptoms.  Physical exam is notable for what appears to be a mobile nodule along the left lower thyroid lobe.  Will check TSH today for evaluation but I do recommend follow-up with PCP for evaluation with appropriate imaging.  Reviewed with patient that if her TSH is abnormal she will still need to follow-up with PCP for ongoing management.  Patient also states that she noticed a lump under her left arm and is concerned about potential lymph node swelling.  Physical exam does reveal a mobile, tender nodule in the left axilla.  Reviewed with patient that this could represent lymphadenopathy, cystic changes and without ultrasound or appropriate imaging I am unable to provide a more definitive diagnosis.  Recommend monitoring for the next 3 to 4 weeks and if axillary nodule has not resolved she should follow-up with PCP.  Discharge Instructions   None    ED Prescriptions   None    PDMP not reviewed this encounter.     [1]  Social History Tobacco Use   Smoking status: Former    Types: Cigarettes   Smokeless tobacco: Never  Vaping Use   Vaping status: Some Days  Substance Use Topics   Alcohol use: Yes    Comment: rare   Drug use: No     Tysean [place], Rocky BRAVO, PA-C 12/10/24 1147  "

## 2024-12-08 ENCOUNTER — Ambulatory Visit (HOSPITAL_COMMUNITY): Payer: Self-pay

## 2024-12-08 LAB — TSH: TSH: 1.75 u[IU]/mL (ref 0.450–4.500)
